# Patient Record
Sex: Female | Born: 1965 | Race: Black or African American | Hispanic: No | Marital: Married | Smoking: Never smoker
Health system: Southern US, Community
[De-identification: ages and names within clinical notes are randomized; demographics above are authoritative.]

## PROBLEM LIST (undated history)

## (undated) DIAGNOSIS — E669 Obesity, unspecified: Secondary | ICD-10-CM

## (undated) DIAGNOSIS — I10 Essential (primary) hypertension: Secondary | ICD-10-CM

## (undated) DIAGNOSIS — K635 Polyp of colon: Secondary | ICD-10-CM

## (undated) DIAGNOSIS — E785 Hyperlipidemia, unspecified: Secondary | ICD-10-CM

## (undated) DIAGNOSIS — K219 Gastro-esophageal reflux disease without esophagitis: Secondary | ICD-10-CM

## (undated) HISTORY — DX: Polyp of colon: K63.5

## (undated) HISTORY — DX: Obesity, unspecified: E66.9

## (undated) HISTORY — DX: Essential (primary) hypertension: I10

## (undated) HISTORY — PX: TUBAL LIGATION: SHX77

## (undated) HISTORY — DX: Hyperlipidemia, unspecified: E78.5

## (undated) HISTORY — DX: Gastro-esophageal reflux disease without esophagitis: K21.9

---

## 1998-08-07 ENCOUNTER — Encounter (INDEPENDENT_AMBULATORY_CARE_PROVIDER_SITE_OTHER): Payer: Self-pay | Admitting: *Deleted

## 1999-01-03 ENCOUNTER — Other Ambulatory Visit: Admission: RE | Admit: 1999-01-03 | Discharge: 1999-01-03 | Payer: Self-pay | Admitting: *Deleted

## 1999-07-25 ENCOUNTER — Other Ambulatory Visit: Admission: RE | Admit: 1999-07-25 | Discharge: 1999-07-25 | Payer: Self-pay | Admitting: Obstetrics and Gynecology

## 1999-08-31 ENCOUNTER — Ambulatory Visit (HOSPITAL_COMMUNITY): Admission: RE | Admit: 1999-08-31 | Discharge: 1999-08-31 | Payer: Self-pay | Admitting: Obstetrics and Gynecology

## 2000-08-05 ENCOUNTER — Other Ambulatory Visit: Admission: RE | Admit: 2000-08-05 | Discharge: 2000-08-05 | Payer: Self-pay | Admitting: Obstetrics and Gynecology

## 2003-11-01 ENCOUNTER — Other Ambulatory Visit: Admission: RE | Admit: 2003-11-01 | Discharge: 2003-11-01 | Payer: Self-pay | Admitting: Family Medicine

## 2004-10-31 ENCOUNTER — Other Ambulatory Visit: Admission: RE | Admit: 2004-10-31 | Discharge: 2004-10-31 | Payer: Self-pay | Admitting: Family Medicine

## 2004-11-14 ENCOUNTER — Ambulatory Visit (HOSPITAL_COMMUNITY): Admission: RE | Admit: 2004-11-14 | Discharge: 2004-11-14 | Payer: Self-pay | Admitting: Family Medicine

## 2005-11-19 ENCOUNTER — Ambulatory Visit (HOSPITAL_COMMUNITY): Admission: RE | Admit: 2005-11-19 | Discharge: 2005-11-19 | Payer: Self-pay | Admitting: Family Medicine

## 2005-12-04 ENCOUNTER — Other Ambulatory Visit: Admission: RE | Admit: 2005-12-04 | Discharge: 2005-12-04 | Payer: Self-pay | Admitting: Family Medicine

## 2006-11-27 ENCOUNTER — Ambulatory Visit (HOSPITAL_COMMUNITY): Admission: RE | Admit: 2006-11-27 | Discharge: 2006-11-27 | Payer: Self-pay | Admitting: Family Medicine

## 2006-12-19 ENCOUNTER — Other Ambulatory Visit: Admission: RE | Admit: 2006-12-19 | Discharge: 2006-12-19 | Payer: Self-pay | Admitting: Family Medicine

## 2007-12-03 ENCOUNTER — Ambulatory Visit (HOSPITAL_COMMUNITY): Admission: RE | Admit: 2007-12-03 | Discharge: 2007-12-03 | Payer: Self-pay | Admitting: Family Medicine

## 2008-12-05 ENCOUNTER — Ambulatory Visit (HOSPITAL_COMMUNITY): Admission: RE | Admit: 2008-12-05 | Discharge: 2008-12-05 | Payer: Self-pay | Admitting: Family Medicine

## 2009-07-05 ENCOUNTER — Other Ambulatory Visit: Admission: RE | Admit: 2009-07-05 | Discharge: 2009-07-05 | Payer: Self-pay | Admitting: Family Medicine

## 2009-12-18 ENCOUNTER — Ambulatory Visit (HOSPITAL_COMMUNITY): Admission: RE | Admit: 2009-12-18 | Discharge: 2009-12-18 | Payer: Self-pay | Admitting: Family Medicine

## 2010-05-18 ENCOUNTER — Encounter (INDEPENDENT_AMBULATORY_CARE_PROVIDER_SITE_OTHER): Payer: Self-pay | Admitting: *Deleted

## 2010-07-05 ENCOUNTER — Ambulatory Visit: Admit: 2010-07-05 | Payer: Self-pay | Admitting: Gastroenterology

## 2010-07-06 ENCOUNTER — Other Ambulatory Visit
Admission: RE | Admit: 2010-07-06 | Discharge: 2010-07-06 | Payer: Self-pay | Source: Home / Self Care | Admitting: Family Medicine

## 2010-08-02 NOTE — Letter (Signed)
Summary: New Patient letter  [facility]  [address] [place], [place] [address]   Phone: [phone]  Fax: [phone]       05/18/2010 MRN: [id number]  Carla Williams [address] [place], [place]  [address]  Dear Ms. Carla Williams,  Welcome to the Gastroenterology Division at [facility].    You are scheduled to see Dr. Jarold Motto on 07/05/2010 at 9:00AM on the 3rd floor at [facility], [address]  We ask that you try to arrive at our office 15 minutes prior to your appointment time to allow for check-in.  We would like you to complete the enclosed self-administered evaluation form prior to your visit and bring it with you on the day of your appointment.  We will review it with you.  Also, please bring a complete list of all your medications or, if you prefer, bring the medication bottles and we will list them.  Please bring your insurance card so that we may make a copy of it.  If your insurance requires a referral to see a specialist, please bring your referral form from your primary care physician.  Co-payments are due at the time of your visit and may be paid by cash, check or credit card.     Your office visit will consist of a consult with your physician (includes a physical exam), any laboratory testing he/she may order, scheduling of any necessary diagnostic testing (e.g. x-ray, ultrasound, CT-scan), and scheduling of a procedure (e.g. Endoscopy, Colonoscopy) if required.  Please allow enough time on your schedule to allow for any/all of these possibilities.    If you cannot keep your appointment, please call [phone] to cancel or reschedule prior to your appointment date.  This allows [place] the opportunity to schedule an appointment for another patient in need of care.  If you do not cancel or reschedule by 5 p.m. the business day prior to your appointment date, you will be charged a $50.00 late cancellation/no-show fee.    Thank you for choosing  [facility] for your medical needs.  We appreciate the opportunity to care for you.  Please visit [place] at our website  to learn more about our practice.                     Sincerely,                                                             The Gastroenterology Division

## 2010-08-10 ENCOUNTER — Ambulatory Visit: Payer: Self-pay | Admitting: Internal Medicine

## 2010-08-16 NOTE — Consult Note (Signed)
Summary: [facility]   Imported By: Sherian Rein 08/10/2010 11:46:23  _____________________________________________________________________  External Attachment:    Type:   Image     Comment:   External Document

## 2010-10-22 ENCOUNTER — Ambulatory Visit: Payer: Self-pay | Admitting: Internal Medicine

## 2010-11-16 NOTE — Op Note (Signed)
[facility]  Patient:    Carla Williams, Carla Williams                       MRN: [id number] Proc. Date: 08/31/99 Adm. Date:  [id number] Disc. Date: [id number] Attending:  Lenoard Aden CC:         [facility]                           Operative Report  PREOPERATIVE DIAGNOSIS:       Desire for elective sterilization.  POSTOPERATIVE DIAGNOSIS:      Desire for elective sterilization.  PROCEDURE:                    Laparoscopic tubal sterilization.  SURGEON:                      Lenoard Aden, M.D.  ANESTHESIA:                   General by Belva Agee, M.D.  ESTIMATED BLOOD LOSS:         Less than 50 cc.  COMPLICATIONS:                None.  DRAINS:                       None.  COUNTS:                       Correct.  DISPOSITION:                  Patient to recovery in good condition.  DESCRIPTION OF PROCEDURE:     After being apprised of the risks of anesthesia, infection, bleeding and injury to abdominal organs with need for repair, patient is brought to the operating room where she is administered a general anesthetic without complication.  Feet are placed in the [facility] stirrups.  She is then prepped and draped in the usual sterile fashion and catheterized until bladder is empty. Exam under anesthesia reveals an anteflexed uterus and no adnexal masses.  At this time, attention is turned to the abdominal portion of the procedure whereby an infraumbilical incision is made with a scalpel, Veress needle placed, opening pressure of 0 noted, 4 liters of CO2 insufflated without difficulty, trocar placed atraumatically, pictures taken; normal liver and gallbladder bed visualized, uterus with small calcified anterior fibroid, normal tubes and ovaries are noted. At this time, Kleppinger bipolar cautery entered into the abdominal cavity and he right tube is cauterized in three contiguous areas of ampullary-isthmic section of the tube, which is  followed out to the fimbriated end; the same procedure is done on the left fallopian tube, which is also traced out to the fimbriated end. Good hemostasis is achieved.  Hook scissors are used to divide both tubes in the cauterized area and tubal lumens are visualized.  Good hemostasis is achieved. CO2 is released.  Scope is removed under direct visualization and the incision is closed using a 0 Vicryl and skin glue is placed in the umbilical incision; good  hemostasis noted.  Instruments and Hulka tenaculum are removed from the vagina.  Patient tolerates procedure and is transferred to recovery in good condition.DD:  08/31/99 TD:  09/03/99 Job: [id number]

## 2010-11-23 ENCOUNTER — Other Ambulatory Visit (HOSPITAL_COMMUNITY): Payer: Self-pay | Admitting: Family Medicine

## 2010-11-23 DIAGNOSIS — Z1231 Encounter for screening mammogram for malignant neoplasm of breast: Secondary | ICD-10-CM

## 2010-12-20 ENCOUNTER — Ambulatory Visit (HOSPITAL_COMMUNITY)
Admission: RE | Admit: 2010-12-20 | Discharge: 2010-12-20 | Disposition: A | Discharge status: Home / Self Care | Payer: BC Managed Care – PPO | Source: Ambulatory Visit | Attending: Family Medicine | Admitting: Family Medicine

## 2010-12-20 DIAGNOSIS — Z1231 Encounter for screening mammogram for malignant neoplasm of breast: Secondary | ICD-10-CM | POA: Insufficient documentation

## 2011-01-14 ENCOUNTER — Encounter: Payer: Self-pay | Admitting: Internal Medicine

## 2011-01-14 ENCOUNTER — Ambulatory Visit (INDEPENDENT_AMBULATORY_CARE_PROVIDER_SITE_OTHER): Payer: BC Managed Care – PPO | Admitting: Internal Medicine

## 2011-01-14 VITALS — BP 132/80 | HR 74 | Ht 65.0 in | Wt 224.0 lb

## 2011-01-14 DIAGNOSIS — Z1211 Encounter for screening for malignant neoplasm of colon: Secondary | ICD-10-CM

## 2011-01-14 DIAGNOSIS — E119 Type 2 diabetes mellitus without complications: Secondary | ICD-10-CM

## 2011-01-14 DIAGNOSIS — K5900 Constipation, unspecified: Secondary | ICD-10-CM

## 2011-01-14 DIAGNOSIS — K219 Gastro-esophageal reflux disease without esophagitis: Secondary | ICD-10-CM

## 2011-01-14 MED ORDER — PEG-KCL-NACL-NASULF-NA ASC-C 100 G PO SOLR: 1.0000 | Freq: Once | ORAL | Status: DC

## 2011-01-14 NOTE — Patient Instructions (Signed)
Colon/Endo [facility] 03/07/11 9:00 am arrive at 8:00 am on 4th floor Moviprep sent to your pharmacy Colon/Endo brochure given to you for review Do not take your diabetic medications day of procedure.

## 2011-01-14 NOTE — Progress Notes (Signed)
HISTORY OF PRESENT ILLNESS:  Carla Williams is a 45 y.o. female hyperlipidemia, diabetes mellitus type 2, and obesity. She presents today regarding chronic problems with GERD. She describes a 3+ year history of problems with intermittent regurgitation and heartburn. Initially placed on Prevacid, subsequently Nexium. However, she takes the PPI therapy sporadically and with symptoms only. Averages several times per week. She is concerned about breakthrough symptoms. Some nocturnal symptoms. She reports 25 pound weight loss over the past 6 months. No dysphagia. Some gagging while brushing her teeth the morning. She also reports several months of constipation and no prior colon cancer screening. Works as a Child psychotherapist.  REVIEW OF SYSTEMS:  All non-GI ROS negative except for menstrual pain, night sweats, excessive thirst and excessive urination  Past Medical History  Diagnosis Date  . GERD (gastroesophageal reflux disease)   . Hyperlipemia   . Diabetes mellitus     Past Surgical History  Procedure Date  . Tubal ligation     Social History Carla Williams  reports that she has never smoked. She has never used smokeless tobacco. She reports that she does not drink alcohol or use illicit drugs.  family history includes Breast cancer in an unspecified family member; Diabetes in her father; and Heart disease in her father.  There is no history of Colon cancer.  No Known Allergies     PHYSICAL EXAMINATION: Vital signs: BP 132/80  Pulse 74  Ht 5\' 5"  (1.651 m)  Wt 224 lb (101.606 kg)  BMI 37.28 kg/m2  LMP 11/18/2010  Constitutional: generally well-appearing, no acute distress Psychiatric: alert and oriented x3, cooperative Eyes: extraocular movements intact, anicteric, conjunctiva pink Mouth: oral pharynx moist, no lesions Neck: supple no lymphadenopathy Cardiovascular: heart regular rate and rhythm, no murmur Lungs: clear to auscultation bilaterally Abdomen: soft, obese, nontender,  nondistended, no obvious ascites, no peritoneal signs, normal bowel sounds, no organomegaly Rectal: Deferred until colonoscopy Extremities: no lower extremity edema bilaterally Skin: no lesions on visible extremities Neuro: No focal deficits.   ASSESSMENT:  #1. Chronic GERD. #2. Colon cancer screening. Baseline risk at a 45 year old African American. #3. Constipation   PLAN:  #1. Advised to take Nexium 40 mg daily. To be taken 30 minutes before the first meal. #2. GERD literature provided for review #3. Reflux precautions #4. Diagnostic upper endoscopy.The nature of the procedure, as well as the risks, benefits, and alternatives were carefully and thoroughly reviewed with the patient. Ample time for discussion and questions allowed. The patient understood, was satisfied, and agreed to proceed.  #5. Screening colonoscopy.The nature of the procedure, as well as the risks, benefits, and alternatives were carefully and thoroughly reviewed with the patient. Ample time for discussion and questions allowed. The patient understood, was satisfied, and agreed to proceed. Movi prep prescribed. The patient instructed on its use. Hold diabetic agent as the day of the examination until resuming oral intake #6. Fiber and water for constipation

## 2011-03-05 ENCOUNTER — Telehealth: Payer: Self-pay | Admitting: Internal Medicine

## 2011-03-05 NOTE — Telephone Encounter (Signed)
No

## 2011-03-06 ENCOUNTER — Encounter: Payer: Self-pay | Admitting: *Deleted

## 2011-03-07 ENCOUNTER — Encounter: Payer: BC Managed Care – PPO | Admitting: Internal Medicine

## 2011-09-02 ENCOUNTER — Encounter (HOSPITAL_COMMUNITY): Payer: Self-pay

## 2011-09-02 ENCOUNTER — Emergency Department (HOSPITAL_COMMUNITY)
Admission: EM | Admit: 2011-09-02 | Discharge: 2011-09-02 | Disposition: A | Discharge status: Home / Self Care | Payer: BC Managed Care – PPO | Source: Home / Self Care

## 2011-09-02 DIAGNOSIS — J069 Acute upper respiratory infection, unspecified: Secondary | ICD-10-CM

## 2011-09-02 MED ORDER — GUAIFENESIN-CODEINE 100-10 MG/5ML PO SYRP: 5.0000 mL | ORAL_SOLUTION | Freq: Three times a day (TID) | ORAL | Status: AC | PRN

## 2011-09-02 NOTE — ED Provider Notes (Signed)
Carla Williams is a 46 y.o. female who presents to Urgent Care today for 3 days of cough headache bodyaches mild fever. She's used Tussend and ibuprofen which has helped some. She denies any chest pain or significant shortness of breath. She thinks her fever is improving. Her max temperature was 101. She works at a daycare.   PMH reviewed. Diabetes and hypertension ROS as above otherwise neg Medications reviewed. No current facility-administered medications for this encounter.   Current Outpatient Prescriptions  Medication Sig Dispense Refill  . atorvastatin (LIPITOR) 40 MG tablet One tablet by mouth once daily       . glimepiride (AMARYL) 4 MG tablet One tablet by mouth once a day       . guaiFENesin-codeine (ROBITUSSIN AC) 100-10 MG/5ML syrup Take 5 mLs by mouth 3 (three) times daily as needed for cough.  120 mL  0  . ibuprofen (ADVIL,MOTRIN) 200 MG tablet Take 200 mg by mouth. As needed       . metFORMIN (GLUCOPHAGE) 500 MG tablet Take 500 mg by mouth 2 (two) times daily with a meal.        . NEXIUM 40 MG capsule As needed       . peg 3350 powder (MOVIPREP) 100 G SOLR Take 1 kit (100 g total) by mouth once.  1 kit  0    Exam:  BP 147/87  Pulse 81  Temp(Src) 100 F (37.8 C) (Oral)  Resp 16  SpO2 100% Gen: Well NAD HEENT: EOMI,  MMM, normal tympanic membranes bilaterally Lungs: CTABL Nl WOB Heart: RRR no MRG Abd: NABS, NT, ND Exts: Non edematous BL  LE, warm and well perfused.   Assessment and Plan: 46 year old woman with viral URI with cough. Plan to treat symptoms with Tylenol and cough medicine consisting of guaifenesin and codeine.  Discussed warning flag signs or symptoms. Followup with PCP in one week if no improvement. Discussed blood pressure results today and encouraged followup.     Clementeen Graham, MD 09/02/11 [id number]

## 2011-09-02 NOTE — Discharge Instructions (Signed)
Thank you for coming in today. You should get better in a few days.  Go to the ER if you have crushing chest pain or trouble breathing.

## 2011-09-02 NOTE — ED Notes (Signed)
Pt c/o non productive cough onset Thursday. Pt states she has generalized body aches and fever.   Pt states she has been using Ibuprofen for fever.

## 2011-09-03 NOTE — ED Provider Notes (Signed)
Medical screening examination/treatment/procedure(s) were performed by resident physician or non-physician practitioner and as supervising physician I was immediately available for consultation/collaboration.   Barkley Bruns MD.    Barkley Bruns, MD 09/03/11 [phone]

## 2011-12-30 ENCOUNTER — Other Ambulatory Visit (HOSPITAL_COMMUNITY): Payer: Self-pay | Admitting: Family Medicine

## 2011-12-30 DIAGNOSIS — Z1231 Encounter for screening mammogram for malignant neoplasm of breast: Secondary | ICD-10-CM

## 2012-01-22 ENCOUNTER — Ambulatory Visit (HOSPITAL_COMMUNITY): Payer: BC Managed Care – PPO

## 2012-03-27 ENCOUNTER — Ambulatory Visit (HOSPITAL_COMMUNITY)
Admission: RE | Admit: 2012-03-27 | Discharge: 2012-03-27 | Disposition: A | Discharge status: Home / Self Care | Payer: BC Managed Care – PPO | Source: Ambulatory Visit | Attending: Family Medicine | Admitting: Family Medicine

## 2012-03-27 DIAGNOSIS — Z1231 Encounter for screening mammogram for malignant neoplasm of breast: Secondary | ICD-10-CM | POA: Insufficient documentation

## 2012-04-15 LAB — HM PAP SMEAR: HM Pap smear: NORMAL

## 2012-12-04 ENCOUNTER — Other Ambulatory Visit (HOSPITAL_COMMUNITY): Payer: Self-pay | Admitting: Family Medicine

## 2012-12-04 DIAGNOSIS — Z1231 Encounter for screening mammogram for malignant neoplasm of breast: Secondary | ICD-10-CM

## 2013-01-28 ENCOUNTER — Emergency Department (HOSPITAL_COMMUNITY)
Admission: EM | Admit: 2013-01-28 | Discharge: 2013-01-28 | Disposition: A | Discharge status: Home / Self Care | Payer: BC Managed Care – PPO | Source: Home / Self Care | Attending: Family Medicine | Admitting: Family Medicine

## 2013-01-28 ENCOUNTER — Ambulatory Visit: Payer: BC Managed Care – PPO | Admitting: Internal Medicine

## 2013-01-28 ENCOUNTER — Encounter (HOSPITAL_COMMUNITY): Payer: Self-pay | Admitting: Emergency Medicine

## 2013-01-28 DIAGNOSIS — IMO0001 Reserved for inherently not codable concepts without codable children: Secondary | ICD-10-CM

## 2013-01-28 DIAGNOSIS — L03019 Cellulitis of unspecified finger: Secondary | ICD-10-CM

## 2013-01-28 MED ORDER — IBUPROFEN 600 MG PO TABS: 600.0000 mg | ORAL_TABLET | Freq: Three times a day (TID) | ORAL | Status: DC | PRN

## 2013-01-28 MED ORDER — HYDROCODONE-ACETAMINOPHEN 5-325 MG PO TABS: 2.0000 | ORAL_TABLET | Freq: Three times a day (TID) | ORAL | Status: DC | PRN

## 2013-01-28 MED ORDER — DOXYCYCLINE HYCLATE 100 MG PO CAPS: 100.0000 mg | ORAL_CAPSULE | Freq: Two times a day (BID) | ORAL | Status: DC

## 2013-01-28 NOTE — ED Notes (Signed)
Pt c/o right middle finger pain onset Sunday... Denies inj/trauma... Recalls having a small paper cut and swelling at the side of the fingernail... Alert w/no signs of acute distress.

## 2013-01-28 NOTE — ED Notes (Signed)
Pt's hand soaking in warm tap water.

## 2013-01-29 NOTE — ED Provider Notes (Signed)
CSN: [id number]     Arrival date & time 01/28/13  1910 History     First MD Initiated Contact with Patient 01/28/13 1924     Chief Complaint  Patient presents with  . Hand Pain   (Consider location/radiation/quality/duration/timing/severity/associated sxs/prior Treatment) HPI Comments: 47 y/o female with h/o diabetes here c/o right middle finger throbbing pain around nail for 4 days. Denies known injury.    Past Medical History  Diagnosis Date  . GERD (gastroesophageal reflux disease)   . Hyperlipemia   . Diabetes mellitus    Past Surgical History  Procedure Laterality Date  . Tubal ligation     Family History  Problem Relation Age of Onset  . Colon cancer Neg Hx   . Breast cancer      Maternal Great Aunt   . Diabetes Father     and Mother  . Heart disease Father    History  Substance Use Topics  . Smoking status: Never Smoker   . Smokeless tobacco: Never Used  . Alcohol Use: No   OB History   Grav Para Term Preterm Abortions TAB SAB Ect Mult Living                 Review of Systems  Constitutional: Negative for fever and chills.  Musculoskeletal: Negative for joint swelling.  Skin:       Right middle finger swelling and pain at the tip as per HPI  All other systems reviewed and are negative.    Allergies  Review of patient's allergies indicates no known allergies.  Home Medications   Current Outpatient Rx  Name  Route  Sig  Dispense  Refill  . atorvastatin (LIPITOR) 40 MG tablet      One tablet by mouth once daily          . glimepiride (AMARYL) 4 MG tablet      One tablet by mouth once a day          . metFORMIN (GLUCOPHAGE) 500 MG tablet   Oral   Take 500 mg by mouth 2 (two) times daily with a meal.           . doxycycline (VIBRAMYCIN) 100 MG capsule   Oral   Take 1 capsule (100 mg total) by mouth 2 (two) times daily.   20 capsule   0   . HYDROcodone-acetaminophen (NORCO/VICODIN) 5-325 MG per tablet   Oral   Take 2 tablets by  mouth every 8 (eight) hours as needed for pain.   6 tablet   0   . ibuprofen (ADVIL,MOTRIN) 600 MG tablet   Oral   Take 1 tablet (600 mg total) by mouth every 8 (eight) hours as needed for pain.   15 tablet   0   . NEXIUM 40 MG capsule      As needed          . peg 3350 powder (MOVIPREP) 100 G SOLR   Oral   Take 1 kit (100 g total) by mouth once.   1 kit   0    BP 136/98  Pulse 84  Temp(Src) 98.2 F (36.8 C)  Resp 16  SpO2 100%  LMP 12/30/2012 Physical Exam  Nursing note and vitals reviewed. Constitutional: She is oriented to person, place, and time. She appears well-developed and well-nourished. No distress.  Cardiovascular: Normal heart sounds.   Pulmonary/Chest: Breath sounds normal.  Musculoskeletal:  Right middle finger: FROM at MPJ, PIPJ and DIPJ. No erythema or  tenderness over shaft of proximal or middle phalange dorsally or on palmar side.  Neurological: She is alert and oriented to person, place, and time.  Skin: She is not diaphoretic.  Right middle finger tip: Mild erythema swelling and tenderness to palpation at radial side of nail consistent with a paronychia. No focal pain at digital pad or other obvious signs suggestive of a felon.     ED Course   INCISION AND DRAINAGE Performed by: Sharin Grave Authorized by: Sharin Grave Consent: Verbal consent obtained. Risks and benefits: risks, benefits and alternatives were discussed Consent given by: patient Patient understanding: patient states understanding of the procedure being performed Patient consent: the patient's understanding of the procedure matches consent given Type: abscess Body area: upper extremity Location details: right long finger Anesthesia: local infiltration Local anesthetic: lidocaine 1% without epinephrine Anesthetic total: 1 ml Scalpel size: 11 Incision type: single straight Complexity: simple Drainage: purulent Drainage amount: scant Patient tolerance: Patient  tolerated the procedure well with no immediate complications. Comments: Antibiotic ointment and compressive dressing applied.   (including critical care time)  Labs Reviewed  CULTURE, ROUTINE-ABSCESS   No results found. 1. Paronychia of third finger, right     MDM  S/p I&D today. Prescribed doxycycline. Ibuprofen and tramadol. Supportive care and red flags that should prompt her return to medical attention discussed with patient and provided in writing.   Sharin Grave, MD 01/29/13 [phone]

## 2013-02-01 LAB — CULTURE, ROUTINE-ABSCESS

## 2013-02-15 ENCOUNTER — Encounter: Payer: Self-pay | Admitting: Internal Medicine

## 2013-02-15 ENCOUNTER — Ambulatory Visit (INDEPENDENT_AMBULATORY_CARE_PROVIDER_SITE_OTHER): Payer: BC Managed Care – PPO | Admitting: Internal Medicine

## 2013-02-15 VITALS — BP 120/80 | HR 85 | Temp 98.4°F | Resp 20 | Ht 65.0 in | Wt 225.0 lb

## 2013-02-15 DIAGNOSIS — I10 Essential (primary) hypertension: Secondary | ICD-10-CM

## 2013-02-15 DIAGNOSIS — E785 Hyperlipidemia, unspecified: Secondary | ICD-10-CM

## 2013-02-15 DIAGNOSIS — E669 Obesity, unspecified: Secondary | ICD-10-CM

## 2013-02-15 DIAGNOSIS — Z0000 Encounter for general adult medical examination without abnormal findings: Secondary | ICD-10-CM

## 2013-02-15 DIAGNOSIS — E119 Type 2 diabetes mellitus without complications: Secondary | ICD-10-CM | POA: Insufficient documentation

## 2013-02-15 DIAGNOSIS — K219 Gastro-esophageal reflux disease without esophagitis: Secondary | ICD-10-CM

## 2013-02-15 NOTE — Progress Notes (Signed)
Subjective:    Patient ID: Carla Williams, female    DOB: Nov 14, 1965, 47 y.o.   MRN: [id number]  HPI  47 year old patient who is seen today to establish with our practice. She has history of type 2 diabetes diagnosed in 2007. She is on her regimen of metformin glyburide and recently has resumed [place]. She has maintained the more recently reasonable glycemic control with fasting blood sugars occasionally less than 100. Her last hemoglobin A1c in June was reportedly greater than 12 however. She has a history of mild hypertension.  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 Status post CABG history of type 2 diabetes Mother age 78 with a history of a cerebral aneurysm also has type 2 diabetes and COPD One brother one sister in good health Brother with  impaired hearing  She complains of significant stress  Past Medical History  Diagnosis Date  . GERD (gastroesophageal reflux disease)   . Hyperlipemia   . Diabetes mellitus     History   Social History  . Marital Status: Married    Spouse Name: N/A    Number of Children: 2  . Years of Education: N/A   Occupational History  . Social Worker    Social History Main Topics  . Smoking status: Never Smoker   . Smokeless tobacco: Never Used  . Alcohol Use: No  . Drug Use: No  . Sexual Activity: Not on file   Other Topics Concern  . Not on file   Social History Narrative   One caffeine drink daily     Past Surgical History  Procedure Laterality Date  . Tubal ligation      Family History  Problem Relation Age of Onset  . Colon cancer Neg Hx   . Breast cancer      Maternal Great Aunt   . Diabetes Father     and Mother  . Heart disease Father     No Known Allergies  Current Outpatient Prescriptions on File Prior to Visit  Medication  Sig Dispense Refill  . atorvastatin (LIPITOR) 40 MG tablet One tablet by mouth once daily       . glimepiride (AMARYL) 4 MG tablet Take 4 mg by mouth 2 (two) times daily. One tablet by mouth once a day      . NEXIUM 40 MG capsule As needed       . peg 3350 powder (MOVIPREP) 100 G SOLR Take 1 kit (100 g total) by mouth once.  1 kit  0   No current facility-administered medications on file prior to visit.    BP 120/80  Pulse 85  Temp(Src) 98.4 F (36.9 C) (Oral)  Resp 20  Ht 5\' 5"  (1.651 m)  Wt 225 lb (102.059 kg)  BMI 37.44 kg/m2  SpO2 99%  LMP 01/20/2013     Review of Systems  Constitutional: Negative.   HENT: Negative for hearing loss, congestion, sore throat, rhinorrhea, dental problem, sinus pressure and tinnitus.   Eyes: Negative for pain, discharge and visual disturbance.  Respiratory: Negative for cough and shortness of breath.   Cardiovascular: Negative for chest pain, palpitations and leg swelling.  Gastrointestinal: Negative for nausea, vomiting, abdominal pain, diarrhea, constipation, blood in stool and abdominal distention.  Genitourinary: Negative for dysuria, urgency, frequency, hematuria, flank pain,  vaginal bleeding, vaginal discharge, difficulty urinating, vaginal pain and pelvic pain.  Musculoskeletal: Negative for joint swelling, arthralgias and gait problem.  Skin: Negative for rash.  Neurological: Negative for dizziness, syncope, speech difficulty, weakness, numbness and headaches.  Hematological: Negative for adenopathy.  Psychiatric/Behavioral: Negative for behavioral problems, dysphoric mood and agitation. The patient is nervous/anxious.        Objective:   Physical Exam  Constitutional: She is oriented to person, place, and time. She appears well-developed and well-nourished.  HENT:  Head: Normocephalic.  Right Ear: External ear normal.  Left Ear: External ear normal.  Mouth/Throat: Oropharynx is clear and moist.  Eyes: Conjunctivae and EOM are  normal. Pupils are equal, round, and reactive to light.  Neck: Normal range of motion. Neck supple. No thyromegaly present.  Cardiovascular: Normal rate, regular rhythm, normal heart sounds and intact distal pulses.   Pulmonary/Chest: Effort normal and breath sounds normal.  Abdominal: Soft. Bowel sounds are normal. She exhibits no mass. There is no tenderness.  Musculoskeletal: Normal range of motion.  Lymphadenopathy:    She has no cervical adenopathy.  Neurological: She is alert and oriented to person, place, and time.  Skin: Skin is warm and dry. No rash noted.  Psychiatric: She has a normal mood and affect. Her behavior is normal.          Assessment & Plan:   Preventive health exam Diabetes mellitus. Will continue present regimen check hemoglobin A1c in 2 months. We'll consider alternate therapy at that time if hemoglobin A1c is not at goal dyslipidemia Obesity

## 2013-02-15 NOTE — Patient Instructions (Addendum)
Limit your sodium (Salt) intake    It is important that you exercise regularly, at least 20 minutes 3 to 4 times per week.  If you develop chest pain or shortness of breath seek  medical attention.  You need to lose weight.  Consider a lower calorie diet and regular exercise.   Please check your hemoglobin A1c every 3 months  Please check your blood pressure on a regular basis.  If it is consistently greater than 150/90, please make an office appointment.   

## 2013-03-10 ENCOUNTER — Telehealth: Payer: Self-pay | Admitting: Internal Medicine

## 2013-03-10 NOTE — Telephone Encounter (Signed)
Pt needs refills on ibuprofen 800 mg,nexium 40 mg #90 atorvastatin 40 mg #90,benezepril-hctz 10-12.5 mg #90 ,metformin 1000mg  twice a day #180 and januvia 100 mg #90 and glimepiride 4 mg twice a day #180 with 3 refills on each meds. [facility] [address]

## 2013-03-11 MED ORDER — IBUPROFEN 800 MG PO TABS: 800.0000 mg | ORAL_TABLET | ORAL | Status: DC | PRN

## 2013-03-11 MED ORDER — GLIMEPIRIDE 4 MG PO TABS: 4.0000 mg | ORAL_TABLET | Freq: Two times a day (BID) | ORAL | Status: DC

## 2013-03-11 MED ORDER — ESOMEPRAZOLE MAGNESIUM 40 MG PO CPDR: 40.0000 mg | DELAYED_RELEASE_CAPSULE | Freq: Every day | ORAL | Status: DC

## 2013-03-11 MED ORDER — BENAZEPRIL-HYDROCHLOROTHIAZIDE 10-12.5 MG PO TABS: 1.0000 | ORAL_TABLET | Freq: Every day | ORAL | Status: DC

## 2013-03-11 MED ORDER — SITAGLIPTIN PHOSPHATE 100 MG PO TABS: 100.0000 mg | ORAL_TABLET | Freq: Every day | ORAL | Status: DC

## 2013-03-11 MED ORDER — ATORVASTATIN CALCIUM 40 MG PO TABS: 40.0000 mg | ORAL_TABLET | Freq: Every day | ORAL | Status: DC

## 2013-03-11 MED ORDER — METFORMIN HCL 1000 MG PO TABS: 1000.0000 mg | ORAL_TABLET | Freq: Two times a day (BID) | ORAL | Status: DC

## 2013-03-11 NOTE — Telephone Encounter (Signed)
Pt notified Rx's were sent to pharmacy as requested. 

## 2013-03-29 ENCOUNTER — Ambulatory Visit (HOSPITAL_COMMUNITY)
Admission: RE | Admit: 2013-03-29 | Discharge: 2013-03-29 | Disposition: A | Discharge status: Home / Self Care | Payer: BC Managed Care – PPO | Source: Ambulatory Visit | Attending: Family Medicine | Admitting: Family Medicine

## 2013-03-29 DIAGNOSIS — Z1231 Encounter for screening mammogram for malignant neoplasm of breast: Secondary | ICD-10-CM | POA: Insufficient documentation

## 2013-05-18 ENCOUNTER — Encounter: Payer: Self-pay | Admitting: Internal Medicine

## 2013-05-18 ENCOUNTER — Ambulatory Visit (INDEPENDENT_AMBULATORY_CARE_PROVIDER_SITE_OTHER): Payer: BC Managed Care – PPO | Admitting: Internal Medicine

## 2013-05-18 VITALS — BP 130/80 | HR 84 | Temp 97.9°F | Resp 20 | Wt 235.0 lb

## 2013-05-18 DIAGNOSIS — I10 Essential (primary) hypertension: Secondary | ICD-10-CM

## 2013-05-18 DIAGNOSIS — Z23 Encounter for immunization: Secondary | ICD-10-CM

## 2013-05-18 DIAGNOSIS — E785 Hyperlipidemia, unspecified: Secondary | ICD-10-CM

## 2013-05-18 DIAGNOSIS — E119 Type 2 diabetes mellitus without complications: Secondary | ICD-10-CM

## 2013-05-18 DIAGNOSIS — E669 Obesity, unspecified: Secondary | ICD-10-CM

## 2013-05-18 LAB — MICROALBUMIN / CREATININE URINE RATIO
Creatinine,U: 274 mg/dL
Microalb Creat Ratio: 0.8 mg/g (ref 0.0–30.0)

## 2013-05-18 MED ORDER — CANAGLIFLOZIN 300 MG PO TABS
1.0000 | ORAL_TABLET | Freq: Every day | ORAL | Status: DC
Start: 2013-05-18 — End: 2014-06-06

## 2013-05-18 NOTE — Progress Notes (Signed)
Pre-visit discussion using our clinic review tool. No additional management support is needed unless otherwise documented below in the visit note.  

## 2013-05-18 NOTE — Progress Notes (Signed)
Subjective:    Patient ID: Carla Williams, female    DOB: 05-11-1966, 47 y.o.   MRN: [id number]  HPI Pre-visit discussion using our clinic review tool. No additional management support is needed unless otherwise documented below in the visit note.  47 year old patient who is seen today for followup of 2 diabetes hypertension and dyslipidemia. There is a history of exogenous obesity and a weight gain of 10 pounds since her last visit. She currently feels well. Since her last visit she has had a mammogram in gynecology evaluation. She is scheduled for her annual eye examination in January No new concerns or complaints. She states blood sugars are generally fairly well controlled morning blood approach 200 later in the day. She is on triple oral therapy.  Past Medical History  Diagnosis Date  . GERD (gastroesophageal reflux disease)   . Hyperlipemia   . Diabetes mellitus     History   Social History  . Marital Status: Married    Spouse Name: N/A    Number of Children: 2  . Years of Education: N/A   Occupational History  . Social Worker    Social History Main Topics  . Smoking status: Never Smoker   . Smokeless tobacco: Never Used  . Alcohol Use: No  . Drug Use: No  . Sexual Activity: Not on file   Other Topics Concern  . Not on file   Social History Narrative   One caffeine drink daily     Past Surgical History  Procedure Laterality Date  . Tubal ligation      Family History  Problem Relation Age of Onset  . Colon cancer Neg Hx   . Breast cancer      Maternal Great Aunt   . Diabetes Father     and Mother  . Heart disease Father     No Known Allergies  Current Outpatient Prescriptions on File Prior to Visit  Medication Sig Dispense Refill  . atorvastatin (LIPITOR) 40 MG tablet Take 1 tablet (40 mg total) by mouth daily. One tablet by mouth once daily  90 tablet  3  . benazepril-hydrochlorthiazide (LOTENSIN HCT) 10-12.5 MG per tablet Take 1 tablet by mouth  daily.  90 tablet  3  . esomeprazole (NEXIUM) 40 MG capsule Take 1 capsule (40 mg total) by mouth daily before breakfast. As needed  90 capsule  3  . glimepiride (AMARYL) 4 MG tablet Take 1 tablet (4 mg total) by mouth 2 (two) times daily. One tablet by mouth once a day  180 tablet  3  . ibuprofen (ADVIL,MOTRIN) 800 MG tablet Take 1 tablet (800 mg total) by mouth as needed for pain.  90 tablet  1  . metFORMIN (GLUCOPHAGE) 1000 MG tablet Take 1 tablet (1,000 mg total) by mouth 2 (two) times daily with a meal.  180 tablet  3  . peg 3350 powder (MOVIPREP) 100 G SOLR Take 1 kit (100 g total) by mouth once.  1 kit  0  . sitaGLIPtin (JANUVIA) 100 MG tablet Take 1 tablet (100 mg total) by mouth daily.  90 tablet  3   No current facility-administered medications on file prior to visit.    BP 130/80  Pulse 84  Temp(Src) 97.9 F (36.6 C) (Oral)  Resp 20  Wt 235 lb (106.595 kg)  SpO2 97%       Review of Systems  Constitutional: Negative.   HENT: Negative for congestion, dental problem, hearing loss, rhinorrhea, sinus pressure, sore throat  and tinnitus.   Eyes: Negative for pain, discharge and visual disturbance.  Respiratory: Negative for cough and shortness of breath.   Cardiovascular: Negative for chest pain, palpitations and leg swelling.  Gastrointestinal: Negative for nausea, vomiting, abdominal pain, diarrhea, constipation, blood in stool and abdominal distention.  Genitourinary: Negative for dysuria, urgency, frequency, hematuria, flank pain, vaginal bleeding, vaginal discharge, difficulty urinating, vaginal pain and pelvic pain.  Musculoskeletal: Negative for arthralgias, gait problem and joint swelling.  Skin: Negative for rash.  Neurological: Negative for dizziness, syncope, speech difficulty, weakness, numbness and headaches.  Hematological: Negative for adenopathy.  Psychiatric/Behavioral: Negative for behavioral problems, dysphoric mood and agitation. The patient is not  nervous/anxious.        Objective:   Physical Exam  Constitutional: She is oriented to person, place, and time. She appears well-developed and well-nourished.  HENT:  Head: Normocephalic.  Right Ear: External ear normal.  Left Ear: External ear normal.  Mouth/Throat: Oropharynx is clear and moist.  Eyes: Conjunctivae and EOM are normal. Pupils are equal, round, and reactive to light.  Neck: Normal range of motion. Neck supple. No thyromegaly present.  Cardiovascular: Normal rate, regular rhythm, normal heart sounds and intact distal pulses.   Pulmonary/Chest: Effort normal and breath sounds normal.  Abdominal: Soft. Bowel sounds are normal. She exhibits no mass. There is no tenderness.  Musculoskeletal: Normal range of motion.  Lymphadenopathy:    She has no cervical adenopathy.  Neurological: She is alert and oriented to person, place, and time.  Skin: Skin is warm and dry. No rash noted.  Psychiatric: She has a normal mood and affect. Her behavior is normal.          Assessment & Plan:   Diabetes. We'll check a hemoglobin A1c. Lifestyle issues discussed. We'll substitute Invokanna for Januvia. Weight loss encouraged Hypertension controlled Obesity dyslipidemia

## 2013-05-18 NOTE — Progress Notes (Signed)
  Subjective:    Patient ID: Carla Williams, female    DOB: 15-Feb-1966, 47 y.o.   MRN: [id number]  HPI  Wt Readings from Last 3 Encounters:  05/18/13 235 lb (106.595 kg)  02/15/13 225 lb (102.059 kg)  01/14/11 224 lb (101.606 kg)     Review of Systems     Objective:   Physical Exam        Assessment & Plan:

## 2013-05-18 NOTE — Patient Instructions (Signed)
Limit your sodium (Salt) intake   Please check your hemoglobin A1c every 3 months  You need to lose weight.  Consider a lower calorie diet and regular exercise.    It is important that you exercise regularly, at least 20 minutes 3 to 4 times per week.  If you develop chest pain or shortness of breath seek  medical attention. 

## 2013-09-17 ENCOUNTER — Ambulatory Visit: Payer: BC Managed Care – PPO | Admitting: Endocrinology

## 2013-09-21 ENCOUNTER — Ambulatory Visit (INDEPENDENT_AMBULATORY_CARE_PROVIDER_SITE_OTHER): Payer: BC Managed Care – PPO | Admitting: Endocrinology

## 2013-09-21 VITALS — BP 118/82 | HR 75 | Temp 98.3°F | Resp 16 | Ht 65.0 in | Wt 229.8 lb

## 2013-09-21 DIAGNOSIS — E785 Hyperlipidemia, unspecified: Secondary | ICD-10-CM

## 2013-09-21 DIAGNOSIS — E669 Obesity, unspecified: Secondary | ICD-10-CM

## 2013-09-21 DIAGNOSIS — I10 Essential (primary) hypertension: Secondary | ICD-10-CM

## 2013-09-21 DIAGNOSIS — E119 Type 2 diabetes mellitus without complications: Secondary | ICD-10-CM

## 2013-09-21 LAB — LIPID PANEL
CHOL/HDL RATIO: 3
Cholesterol: 143 mg/dL (ref 0–200)
HDL: 55.7 mg/dL (ref >39.00)
LDL CALC: 72 mg/dL (ref 0–99)
Triglycerides: 75 mg/dL (ref 0.0–149.0)
VLDL: 15 mg/dL (ref 0.0–40.0)

## 2013-09-21 LAB — COMPREHENSIVE METABOLIC PANEL
ALK PHOS: 47 U/L (ref 39–117)
ALT: 39 U/L — AB (ref 0–35)
AST: 32 U/L (ref 0–37)
Albumin: 4.2 g/dL (ref 3.5–5.2)
BILIRUBIN TOTAL: 1 mg/dL (ref 0.3–1.2)
BUN: 10 mg/dL (ref 6–23)
CO2: 27 mEq/L (ref 19–32)
Calcium: 9.3 mg/dL (ref 8.4–10.5)
Chloride: 100 mEq/L (ref 96–112)
Creatinine, Ser: 0.8 mg/dL (ref 0.4–1.2)
GFR: 101.22 mL/min (ref >60.00)
Glucose, Bld: 96 mg/dL (ref 70–99)
Potassium: 4 mEq/L (ref 3.5–5.1)
SODIUM: 135 meq/L (ref 135–145)
TOTAL PROTEIN: 6.9 g/dL (ref 6.0–8.3)

## 2013-09-21 LAB — HEMOGLOBIN A1C: Hgb A1c MFr Bld: 7.9 % — ABNORMAL HIGH (ref 4.6–6.5)

## 2013-09-21 LAB — GLUCOSE, POCT (MANUAL RESULT ENTRY): POC GLUCOSE: 118 mg/dL — AB (ref 70–99)

## 2013-09-21 MED ORDER — DULAGLUTIDE 0.75 MG/0.5ML ~~LOC~~ SOAJ: 0.7500 mg | SUBCUTANEOUS | Status: DC

## 2013-09-21 NOTE — Progress Notes (Signed)
Patient ID: Carla Williams, female   DOB: 1965-07-18, 48 y.o.   MRN: [id number]    Reason for Appointment : Consultation for Type 2 Diabetes  History of Present Illness          Diagnosis: Type 2 diabetes mellitus, date of diagnosis: 2007      Past history: She was initially told to have mild diabetes and advised to lose weight with diet and exercise. About a year later because of higher blood sugars she was started on metformin low dose and this was increased. However because of lack of adequate control she was given Amaryl in addition soon after She thinks her blood sugars and overall were subsequently controlled with A1c as low as around 6% but records are not available In 2014 blood sugars apparently started increasing significantly. She was initially given Januvia but subsequently in 11/14 she still had a high A1c Previously she thinks she had been somewhat irregular with her medication compliance  Recent history:  She has taken [place] since 11/14 which was started because of a high A1c of 9.1% She is subsequently also has been trying to be more compliant with all her medications Blood sugars may be somewhat better but she has not lost much weight Only recently had mild yeast infection which is improved now However has been checking her blood sugar somewhat irregularly and did not bring any records for review. Having some difficulty using her new glucose monitor also She thinks her blood sugars are mostly high after meals       Oral hypoglycemic drugs the patient is taking are: Invokana, Amaryl, metformin      Side effects from medications have been: No significant side effects  Glucose monitoring:  done < one time a day         Glucometer: One Touch .      Blood Glucose readings in the morning: 127, 136. After meals: 213, 327  Hypoglycemia: None      Glycemic control:   Lab Results  Component Value Date   HGBA1C 9.1* 05/18/2013   Lab Results  Component Value Date   MICROALBUR 2.3* 05/18/2013    Self-care: The diet that the patient has been following is: tries to limit carbohydrates.      Meals: 2-3 meals per day. Lunch may be larger           Exercise: none          Dietician visit: never             Compliance with the medical regimen:  Retinal exam: Most recent:    Weight history:  Wt Readings from Last 3 Encounters:  09/21/13 229 lb 12.8 oz (104.237 kg)  05/18/13 235 lb (106.595 kg)  02/15/13 225 lb (102.059 kg)      Medication List       This list is accurate as of: 09/21/13  9:09 AM.  Always use your most recent med list.               atorvastatin 40 MG tablet  Commonly known as:  LIPITOR  Take 1 tablet (40 mg total) by mouth daily. One tablet by mouth once daily     benazepril-hydrochlorthiazide 10-12.5 MG per tablet  Commonly known as:  LOTENSIN HCT  Take 1 tablet by mouth daily.     Canagliflozin 300 MG Tabs  Commonly known as:  INVOKANA  Take 1 tablet (300 mg total) by mouth daily.     esomeprazole  40 MG capsule  Commonly known as:  NEXIUM  Take 1 capsule (40 mg total) by mouth daily before breakfast. As needed     glimepiride 4 MG tablet  Commonly known as:  AMARYL  Take 1 tablet (4 mg total) by mouth 2 (two) times daily. One tablet by mouth once a day     ibuprofen 800 MG tablet  Commonly known as:  ADVIL,MOTRIN  Take 1 tablet (800 mg total) by mouth as needed for pain.     metFORMIN 1000 MG tablet  Commonly known as:  GLUCOPHAGE  Take 1 tablet (1,000 mg total) by mouth 2 (two) times daily with a meal.     peg 3350 powder 100 G Solr  Commonly known as:  MOVIPREP  Take 1 kit (100 g total) by mouth once.        Allergies: No Known Allergies  Past Medical History  Diagnosis Date  . GERD (gastroesophageal reflux disease)   . Hyperlipemia   . Diabetes mellitus     Past Surgical History  Procedure Laterality Date  . Tubal ligation      Family History  Problem Relation Age of Onset  . Colon  cancer Neg Hx   . Breast cancer      Maternal Great Aunt   . Diabetes Father     and Mother  . Heart disease Father     Social History:  reports that she has never smoked. She has never used smokeless tobacco. She reports that she does not drink alcohol or use illicit drugs.    Review of Systems       Lipids: She has been on Lipitor from her previous PCP but not clear of her level of control  No results found for this basename: CHOL, HDL, LDLCALC, LDLDIRECT, TRIG, CHOLHDL                    Skin: No rash or any infections     Thyroid:  No  unusual fatigue.     The blood pressure has been controlled with Lotensin HCT over the last couple of years      No swelling of feet.     No shortness of breath on exertion.     Bowel habits: Normal.       No frequency of urination or nocturia       No joint  Pains. She is having pain in her left hand for the last 2-3 months. This is intermittent and sometimes sharp. Not associated with numbness, tingling or pins and needles. May be better with diving, typing and sometimes at night       No  depression          No history of Numbness, tingling or burning in  feet          No periods since 9/14, hot flashes gone now. Not on HRT; has been followed by gynecologist   LABS:  No visits with results within 1 Week(s) from this visit. Latest known visit with results is:  Office Visit on 05/18/2013  Component Date Value Ref Range Status  . Microalb, Ur 05/18/2013 2.3* 0.0 - 1.9 mg/dL Final  . Creatinine,U 05/18/2013 274.0   Final  . Microalb Creat Ratio 05/18/2013 0.8  0.0 - 30.0 mg/g Final  . Hemoglobin A1C 05/18/2013 9.1* 4.6 - 6.5 % Final   Glycemic Control Guidelines for People with Diabetes:Non Diabetic:  <6%Goal of Therapy: <7%Additional Action Suggested:  >8%  Physical Examination:  BP 118/82  Pulse 75  Temp(Src) 98.3 F (36.8 C)  Resp 16  Ht 5' 5"  (1.651 m)  Wt 229 lb 12.8 oz (104.237 kg)  BMI 38.24 kg/m2  SpO2 97%   LMP 03/15/2013  GENERAL:         Patient has generalized obesity.   HEENT:         Eye exam shows normal external appearance. Fundus exam shows no retinopathy. Oral exam shows normal mucosa .  NECK:         General:  Neck exam shows no lymphadenopathy. Carotids are normal to palpation and no bruit heard.  Thyroid is not enlarged and no nodules felt.   LUNGS:         Chest is symmetrical. Lungs are clear to auscultation.Marland Kitchen   HEART:         Heart sounds:  S1 and S2 are normal. No murmurs or clicks heard., no S3 or S4.   ABDOMEN:         General:  There is no distention present. Liver and spleen are not palpable. No other mass or tenderness present.  EXTREMITIES:     There is no edema. No skin lesions present.Marland Kitchen  NEUROLOGICAL:        Tinel sign is negative on the left Vibration sense is minimally reduced in toes. Ankle jerks are 2+ bilaterally.          Diabetic foot exam:  as in the foot exam section MUSCULOSKELETAL:       There is no enlargement or deformity of the joints. Spine is normal to inspection.Marland Kitchen   PEDAL pulses: Normal SKIN:       No rash. She does have  Mild to moderate acanthosis with pigmentation of her neck     ASSESSMENT:  Diabetes type 2, uncontrolled    She appears to have had progression of her diabetes over the last 7 years and appears to still have high readings despite 3 drug regimen of maximum doses Invokana, metformin and Amaryl She also has difficulty losing weight and has a BMI of almost 40 Although she has some room for improvement of her diet and exercise regimen she needs more counseling for meal planning and day-to-day care  Complications: None, needs regular eye exams. There is no evidence of neuropathy or nephropathy  HYPERTENSION: Blood pressure well controlled  Left arm pain. May have carpal tunnel syndrome and she will discuss with PCP  PLAN:  She is a good candidate for a GLP drug for improved diabetes control and weight loss. Discussed with the patient  the nature of GLP-1 drugs, the action on various organ systems, how they benefit blood glucose control, as well as the benefit of weight loss and  increase satiety . Explained possible side effects especially nausea and vomiting; discussed safety information in package insert. Specifically we discussed Trulicity and Victoza.  Demonstrated the medication injection device for Trulicity and injection technique to the patient. Discussed injection sites and titration of Trulicity starting with 0.75 mg once a week for 2 weeks and then increasing to 1.5 mg if no symptoms of nausea.  Patient brochure on Trulicity and co-pay card given She will call if she has any difficulties with the medication or insurance coverage especially since Victoza is preferred on her formulary  Also she will need further diabetes education and will refer her for nutritional counseling For now will reduce her Amaryl to 2 mg in the evening and consider stopping the  evening dose To continue metformin and Invokana Regular exercise  HYPERLIPIDEMIA: Will have her labs checked as no recent levels available   Carla Williams 09/21/2013, 9:09 AM

## 2013-09-21 NOTE — Patient Instructions (Signed)
Reduce pm Glimeperide to 1/2 and if sugar in am get <100

## 2013-10-07 ENCOUNTER — Encounter: Payer: Self-pay | Admitting: Family Medicine

## 2013-10-07 ENCOUNTER — Ambulatory Visit (INDEPENDENT_AMBULATORY_CARE_PROVIDER_SITE_OTHER): Payer: BC Managed Care – PPO | Admitting: Family Medicine

## 2013-10-07 VITALS — BP 106/76 | HR 84 | Temp 98.6°F | Wt 230.2 lb

## 2013-10-07 DIAGNOSIS — R05 Cough: Secondary | ICD-10-CM

## 2013-10-07 DIAGNOSIS — J329 Chronic sinusitis, unspecified: Secondary | ICD-10-CM

## 2013-10-07 DIAGNOSIS — R059 Cough, unspecified: Secondary | ICD-10-CM

## 2013-10-07 MED ORDER — AMOXICILLIN-POT CLAVULANATE 875-125 MG PO TABS: 1.0000 | ORAL_TABLET | Freq: Two times a day (BID) | ORAL | Status: DC

## 2013-10-07 MED ORDER — HYDROCODONE-HOMATROPINE 5-1.5 MG/5ML PO SYRP: ORAL_SOLUTION | ORAL | Status: DC

## 2013-10-07 NOTE — Patient Instructions (Addendum)
Get otc antihistamine and flonase or nasacort    Sinusitis Sinusitis is redness, soreness, and swelling (inflammation) of the paranasal sinuses. Paranasal sinuses are air pockets within the bones of your face (beneath the eyes, the middle of the forehead, or above the eyes). In healthy paranasal sinuses, mucus is able to drain out, and air is able to circulate through them by way of your nose. However, when your paranasal sinuses are inflamed, mucus and air can become trapped. This can allow bacteria and other germs to grow and cause infection. Sinusitis can develop quickly and last only a short time (acute) or continue over a long period (chronic). Sinusitis that lasts for more than 12 weeks is considered chronic.  CAUSES  Causes of sinusitis include:  Allergies.  Structural abnormalities, such as displacement of the cartilage that separates your nostrils (deviated septum), which can decrease the air flow through your nose and sinuses and affect sinus drainage.  Functional abnormalities, such as when the small hairs (cilia) that line your sinuses and help remove mucus do not work properly or are not present. SYMPTOMS  Symptoms of acute and chronic sinusitis are the same. The primary symptoms are pain and pressure around the affected sinuses. Other symptoms include:  Upper toothache.  Earache.  Headache.  Bad breath.  Decreased sense of smell and taste.  A cough, which worsens when you are lying flat.  Fatigue.  Fever.  Thick drainage from your nose, which often is green and may contain pus (purulent).  Swelling and warmth over the affected sinuses. DIAGNOSIS  Your caregiver will perform a physical exam. During the exam, your caregiver may:  Look in your nose for signs of abnormal growths in your nostrils (nasal polyps).  Tap over the affected sinus to check for signs of infection.  View the inside of your sinuses (endoscopy) with a special imaging device with a light  attached (endoscope), which is inserted into your sinuses. If your caregiver suspects that you have chronic sinusitis, one or more of the following tests may be recommended:  Allergy tests.  Nasal culture A sample of mucus is taken from your nose and sent to a lab and screened for bacteria.  Nasal cytology A sample of mucus is taken from your nose and examined by your caregiver to determine if your sinusitis is related to an allergy. TREATMENT  Most cases of acute sinusitis are related to a viral infection and will resolve on their own within 10 days. Sometimes medicines are prescribed to help relieve symptoms (pain medicine, decongestants, nasal steroid sprays, or saline sprays).  However, for sinusitis related to a bacterial infection, your caregiver will prescribe antibiotic medicines. These are medicines that will help kill the bacteria causing the infection.  Rarely, sinusitis is caused by a fungal infection. In theses cases, your caregiver will prescribe antifungal medicine. For some cases of chronic sinusitis, surgery is needed. Generally, these are cases in which sinusitis recurs more than 3 times per year, despite other treatments. HOME CARE INSTRUCTIONS   Drink plenty of water. Water helps thin the mucus so your sinuses can drain more easily.  Use a humidifier.  Inhale steam 3 to 4 times a day (for example, sit in the bathroom with the shower running).  Apply a warm, moist washcloth to your face 3 to 4 times a day, or as directed by your caregiver.  Use saline nasal sprays to help moisten and clean your sinuses.  Take over-the-counter or prescription medicines for pain, discomfort, or  fever only as directed by your caregiver. SEEK IMMEDIATE MEDICAL CARE IF:  You have increasing pain or severe headaches.  You have nausea, vomiting, or drowsiness.  You have swelling around your face.  You have vision problems.  You have a stiff neck.  You have difficulty breathing. MAKE  SURE YOU:   Understand these instructions.  Will watch your condition.  Will get help right away if you are not doing well or get worse. Document Released: 06/17/2005 Document Revised: 09/09/2011 Document Reviewed: 07/02/2011 [facility] Patient Information 2014 [place], [place].

## 2013-10-07 NOTE — Progress Notes (Signed)
Pre visit review using our clinic review tool, if applicable. No additional management support is needed unless otherwise documented below in the visit note. 

## 2013-10-07 NOTE — Progress Notes (Signed)
  Subjective:     Carla Williams is a 48 y.o. female who presents for evaluation of symptoms of a URI. Symptoms include right ear pressure/pain, congestion, cough described as productive in am, low grade fever, post nasal drip, sinus pressure and sore throat. Onset of symptoms was 4 days ago, and has been gradually worsening since that time. Treatment to date: none.  The following portions of the patient's history were reviewed and updated as appropriate: allergies, current medications, past family history, past medical history, past social history, past surgical history and problem list.  Review of Systems Pertinent items are noted in HPI.   Objective:    BP 106/76  Pulse 84  Temp(Src) 98.6 F (37 C) (Oral)  Wt 230 lb 3.2 oz (104.418 kg)  SpO2 98%  LMP 03/15/2013 General appearance: alert, cooperative, appears stated age and no distress Head: Normocephalic, without obvious abnormality, atraumatic Ears: normal TM's and external ear canals both ears Nose: green discharge, moderate congestion, turbinates red, swollen, sinus tenderness bilateral Throat: lips, mucosa, and tongue normal; teeth and gums normal Neck: moderate anterior cervical adenopathy, supple, symmetrical, trachea midline and thyroid not enlarged, symmetric, no tenderness/mass/nodules Lungs: clear to auscultation bilaterally Heart: S1, S2 normal Extremities: extremities normal, atraumatic, no cyanosis or edema   Assessment:    sinusitis   Plan:    Discussed the diagnosis and treatment of sinusitis. Suggested symptomatic OTC remedies. Augmentin per orders. Nasal steroids per orders. Follow up as needed.

## 2013-10-15 ENCOUNTER — Other Ambulatory Visit: Payer: BC Managed Care – PPO

## 2013-10-19 ENCOUNTER — Ambulatory Visit: Payer: BC Managed Care – PPO | Admitting: Endocrinology

## 2014-02-14 ENCOUNTER — Ambulatory Visit (INDEPENDENT_AMBULATORY_CARE_PROVIDER_SITE_OTHER): Payer: BC Managed Care – PPO | Admitting: Internal Medicine

## 2014-02-14 ENCOUNTER — Encounter: Payer: Self-pay | Admitting: Internal Medicine

## 2014-02-14 VITALS — BP 136/90 | HR 74 | Temp 98.1°F | Resp 20 | Ht 65.0 in | Wt 226.0 lb

## 2014-02-14 DIAGNOSIS — I10 Essential (primary) hypertension: Secondary | ICD-10-CM

## 2014-02-14 DIAGNOSIS — F411 Generalized anxiety disorder: Secondary | ICD-10-CM

## 2014-02-14 DIAGNOSIS — E119 Type 2 diabetes mellitus without complications: Secondary | ICD-10-CM

## 2014-02-14 MED ORDER — LORAZEPAM 0.5 MG PO TABS: 0.5000 mg | ORAL_TABLET | Freq: Two times a day (BID) | ORAL | Status: DC | PRN

## 2014-02-14 NOTE — Progress Notes (Signed)
Pre visit review using our clinic review tool, if applicable. No additional management support is needed unless otherwise documented below in the visit note. 

## 2014-02-14 NOTE — Patient Instructions (Signed)
Call or return to clinic prn if these symptoms worsen or fail to improve as anticipated.  Stress and Stress Management Stress is a normal reaction to life events. It is what you feel when life demands more than you are used to or more than you can handle. Some stress can be useful. For example, the stress reaction can help you catch the last bus of the day, study for a test, or meet a deadline at work. But stress that occurs too often or for too long can cause problems. It can affect your emotional health and interfere with relationships and normal daily activities. Too much stress can weaken your immune system and increase your risk for physical illness. If you already have a medical problem, stress can make it worse. CAUSES  All sorts of life events may cause stress. An event that causes stress for one person may not be stressful for another person. Major life events commonly cause stress. These may be positive or negative. Examples include losing your job, moving into a new home, getting married, having a baby, or losing a loved one. Less obvious life events may also cause stress, especially if they occur day after day or in combination. Examples include working long hours, driving in traffic, caring for children, being in debt, or being in a difficult relationship. SIGNS AND SYMPTOMS Stress may cause emotional symptoms including, the following:  Anxiety. This is feeling worried, afraid, on edge, overwhelmed, or out of control.  Anger. This is feeling irritated or impatient.  Depression. This is feeling sad, down, helpless, or guilty.  Difficulty focusing, remembering, or making decisions. Stress may cause physical symptoms, including the following:   Aches and pains. These may affect your head, neck, back, stomach, or other areas of your body.  Tight muscles or clenched jaw.  Low energy or trouble sleeping. Stress may cause unhealthy behaviors, including the following:   Eating to feel  better (overeating) or skipping meals.  Sleeping too little, too much, or both.  Working too much or putting off tasks (procrastination).  Smoking, drinking alcohol, or using drugs to feel better. DIAGNOSIS  Stress is diagnosed through an assessment by your health care provider. Your health care provider will ask questions about your symptoms and any stressful life events.Your health care provider will also ask about your medical history and may order blood tests or other tests. Certain medical conditions and medicine can cause physical symptoms similar to stress. Mental illness can cause emotional symptoms and unhealthy behaviors similar to stress. Your health care provider may refer you to a mental health professional for further evaluation.  TREATMENT  Stress management is the recommended treatment for stress.The goals of stress management are reducing stressful life events and coping with stress in healthy ways.  Techniques for reducing stressful life events include the following:  Stress identification. Self-monitor for stress and identify what causes stress for you. These skills may help you to avoid some stressful events.  Time management. Set your priorities, keep a calendar of events, and learn to say "no." These tools can help you avoid making too many commitments. Techniques for coping with stress include the following:  Rethinking the problem. Try to think realistically about stressful events rather than ignoring them or overreacting. Try to find the positives in a stressful situation rather than focusing on the negatives.  Exercise. Physical exercise can release both physical and emotional tension. The key is to find a form of exercise you enjoy and do it regularly.  Relaxation techniques. These relax the body and mind. Examples include yoga, meditation, tai chi, biofeedback, deep breathing, progressive muscle relaxation, listening to music, being out in nature, journaling, and  other hobbies. Again, the key is to find one or more that you enjoy and can do regularly.  Healthy lifestyle. Eat a balanced diet, get plenty of sleep, and do not smoke. Avoid using alcohol or drugs to relax.  Strong support network. Spend time with family, friends, or other people you enjoy being around.Express your feelings and talk things over with someone you trust. Counseling or talktherapy with a mental health professional may be helpful if you are having difficulty managing stress on your own. Medicine is typically not recommended for the treatment of stress.Talk to your health care provider if you think you need medicine for symptoms of stress. HOME CARE INSTRUCTIONS  Keep all follow-up visits as directed by your health care provider.  Take all medicines as directed by your health care provider. SEEK MEDICAL CARE IF:  Your symptoms get worse or you start having new symptoms.  You feel overwhelmed by your problems and can no longer manage them on your own. SEEK IMMEDIATE MEDICAL CARE IF:  You feel like hurting yourself or someone else. Document Released: 12/11/2000 Document Revised: 11/01/2013 Document Reviewed: 02/09/2013 [facility] Patient Information 2015 [place], [place]. This information is not intended to replace advice given to you by your health care provider. Make sure you discuss any questions you have with your health care provider.

## 2014-02-14 NOTE — Progress Notes (Signed)
Subjective:    Patient ID: Carla Williams, female    DOB: 03-10-1966, 48 y.o.   MRN: [id number]  HPI  48 year old patient history of hypertension and diabetes who complains of increase in anxiety over the past 2 months.  There have been some health concerns in the family, as well as feeling overwhelmed at work.  She describes anxiety and poor sleep habits for the past 8 weeks. No history of depression.  Past Medical History  Diagnosis Date  . GERD (gastroesophageal reflux disease)   . Hyperlipemia   . Diabetes mellitus     History   Social History  . Marital Status: Married    Spouse Name: N/A    Number of Children: 2  . Years of Education: N/A   Occupational History  . Social Worker    Social History Main Topics  . Smoking status: Never Smoker   . Smokeless tobacco: Never Used  . Alcohol Use: No  . Drug Use: No  . Sexual Activity: Not on file   Other Topics Concern  . Not on file   Social History Narrative   One caffeine drink daily     Past Surgical History  Procedure Laterality Date  . Tubal ligation      Family History  Problem Relation Age of Onset  . Colon cancer Neg Hx   . Breast cancer      Maternal Great Aunt   . Diabetes Father     and Mother  . Heart disease Father     No Known Allergies  Current Outpatient Prescriptions on File Prior to Visit  Medication Sig Dispense Refill  . atorvastatin (LIPITOR) 40 MG tablet Take 1 tablet (40 mg total) by mouth daily. One tablet by mouth once daily  90 tablet  3  . benazepril-hydrochlorthiazide (LOTENSIN HCT) 10-12.5 MG per tablet Take 1 tablet by mouth daily.  90 tablet  3  . Canagliflozin (INVOKANA) 300 MG TABS Take 1 tablet (300 mg total) by mouth daily.  90 tablet  6  . esomeprazole (NEXIUM) 40 MG capsule Take 1 capsule (40 mg total) by mouth daily before breakfast. As needed  90 capsule  3  . glimepiride (AMARYL) 4 MG tablet Take 1 tablet (4 mg total) by mouth 2 (two) times daily. One tablet by  mouth once a day  180 tablet  3  . ibuprofen (ADVIL,MOTRIN) 800 MG tablet Take 1 tablet (800 mg total) by mouth as needed for pain.  90 tablet  1  . metFORMIN (GLUCOPHAGE) 1000 MG tablet Take 1 tablet (1,000 mg total) by mouth 2 (two) times daily with a meal.  180 tablet  3  . HYDROcodone-homatropine (HYCODAN) 5-1.5 MG/5ML syrup 1 tsp po qhs prn cough  120 mL  0   No current facility-administered medications on file prior to visit.    BP 136/90  Pulse 74  Temp(Src) 98.1 F (36.7 C) (Oral)  Resp 20  Ht 5\' 5"  (1.651 m)  Wt 226 lb (102.513 kg)  BMI 37.61 kg/m2  SpO2 98%     Review of Systems  Constitutional: Negative.   HENT: Negative for congestion, dental problem, hearing loss, rhinorrhea, sinus pressure, sore throat and tinnitus.   Eyes: Negative for pain, discharge and visual disturbance.  Respiratory: Negative for cough and shortness of breath.   Cardiovascular: Negative for chest pain, palpitations and leg swelling.  Gastrointestinal: Negative for nausea, vomiting, abdominal pain, diarrhea, constipation, blood in stool and abdominal distention.  Genitourinary: Negative for dysuria, urgency, frequency, hematuria, flank pain, vaginal bleeding, vaginal discharge, difficulty urinating, vaginal pain and pelvic pain.  Musculoskeletal: Negative for arthralgias, gait problem and joint swelling.  Skin: Negative for rash.  Neurological: Negative for dizziness, syncope, speech difficulty, weakness, numbness and headaches.  Hematological: Negative for adenopathy.  Psychiatric/Behavioral: Positive for sleep disturbance. Negative for behavioral problems, dysphoric mood and agitation. The patient is nervous/anxious.        Objective:   Physical Exam  Constitutional: She is oriented to person, place, and time. She appears well-developed and well-nourished.  HENT:  Head: Normocephalic.  Right Ear: External ear normal.  Left Ear: External ear normal.  Mouth/Throat: Oropharynx is clear  and moist.  Eyes: Conjunctivae and EOM are normal. Pupils are equal, round, and reactive to light.  Neck: Normal range of motion. Neck supple. No thyromegaly present.  Cardiovascular: Normal rate, regular rhythm, normal heart sounds and intact distal pulses.   Pulmonary/Chest: Effort normal and breath sounds normal.  Abdominal: Soft. Bowel sounds are normal. She exhibits no mass. There is no tenderness.  Musculoskeletal: Normal range of motion.  Lymphadenopathy:    She has no cervical adenopathy.  Neurological: She is alert and oriented to person, place, and time.  Skin: Skin is warm and dry. No rash noted.  Psychiatric: She has a normal mood and affect. Her behavior is normal. Judgment and thought content normal.          Assessment & Plan:   Anxiety disorder.  Will treat with short-term lorazepam.  Behavioral health referral.  Information concerning stress management, dispensed.  Recheck in 4 weeks or as needed Diabetes mellitus.  Followup endocrinology Hypertension stable

## 2014-02-15 ENCOUNTER — Telehealth: Payer: Self-pay | Admitting: Internal Medicine

## 2014-02-15 NOTE — Telephone Encounter (Signed)
Relevant patient education assigned to patient using Emmi. ° °

## 2014-03-09 ENCOUNTER — Other Ambulatory Visit: Payer: Self-pay | Admitting: Internal Medicine

## 2014-03-09 DIAGNOSIS — Z1231 Encounter for screening mammogram for malignant neoplasm of breast: Secondary | ICD-10-CM

## 2014-03-11 ENCOUNTER — Telehealth: Payer: Self-pay | Admitting: *Deleted

## 2014-03-11 NOTE — Telephone Encounter (Signed)
Left message on machine for patient to schedule nurse visit appointment for a BP check

## 2014-03-21 ENCOUNTER — Other Ambulatory Visit: Payer: Self-pay | Admitting: Internal Medicine

## 2014-03-30 ENCOUNTER — Ambulatory Visit (HOSPITAL_COMMUNITY)
Admission: RE | Admit: 2014-03-30 | Discharge: 2014-03-30 | Disposition: A | Discharge status: Home / Self Care | Payer: BC Managed Care – PPO | Source: Ambulatory Visit | Attending: Internal Medicine | Admitting: Internal Medicine

## 2014-03-30 DIAGNOSIS — Z1231 Encounter for screening mammogram for malignant neoplasm of breast: Secondary | ICD-10-CM | POA: Diagnosis not present

## 2014-04-22 ENCOUNTER — Other Ambulatory Visit: Payer: Self-pay | Admitting: Internal Medicine

## 2014-05-12 ENCOUNTER — Other Ambulatory Visit (INDEPENDENT_AMBULATORY_CARE_PROVIDER_SITE_OTHER): Payer: BC Managed Care – PPO

## 2014-05-12 DIAGNOSIS — E119 Type 2 diabetes mellitus without complications: Secondary | ICD-10-CM

## 2014-05-12 LAB — BASIC METABOLIC PANEL
BUN: 10 mg/dL (ref 6–23)
CO2: 20 meq/L (ref 19–32)
Calcium: 9.5 mg/dL (ref 8.4–10.5)
Chloride: 104 mEq/L (ref 96–112)
Creatinine, Ser: 0.7 mg/dL (ref 0.4–1.2)
GFR: 116.29 mL/min (ref >60.00)
GLUCOSE: 139 mg/dL — AB (ref 70–99)
POTASSIUM: 4 meq/L (ref 3.5–5.1)
Sodium: 138 mEq/L (ref 135–145)

## 2014-05-17 ENCOUNTER — Encounter: Payer: Self-pay | Admitting: Endocrinology

## 2014-05-17 ENCOUNTER — Ambulatory Visit (INDEPENDENT_AMBULATORY_CARE_PROVIDER_SITE_OTHER): Payer: BC Managed Care – PPO | Admitting: Endocrinology

## 2014-05-17 VITALS — BP 125/82 | HR 82 | Temp 98.1°F | Resp 16 | Ht 65.0 in | Wt 225.6 lb

## 2014-05-17 DIAGNOSIS — IMO0002 Reserved for concepts with insufficient information to code with codable children: Secondary | ICD-10-CM

## 2014-05-17 DIAGNOSIS — Z23 Encounter for immunization: Secondary | ICD-10-CM

## 2014-05-17 DIAGNOSIS — E1165 Type 2 diabetes mellitus with hyperglycemia: Secondary | ICD-10-CM

## 2014-05-17 LAB — FRUCTOSAMINE: Fructosamine: 304 umol/L — ABNORMAL HIGH (ref 190–270)

## 2014-05-17 MED ORDER — METFORMIN HCL ER 500 MG PO TB24: 2000.0000 mg | ORAL_TABLET | Freq: Every day | ORAL | Status: DC

## 2014-05-17 NOTE — Patient Instructions (Signed)
If not eating breakfast take only 1/2 Metformin

## 2014-05-17 NOTE — Progress Notes (Signed)
Patient ID: Carla Williams, female   DOB: 1965-11-18, 48 y.o.   MRN: [id number]    Reason for Appointment : for Type 2 Diabetes  History of Present Illness          Diagnosis: Type 2 diabetes mellitus, date of diagnosis: 2007      Past history: She was initially told to have mild diabetes and advised to lose weight with diet and exercise. About a year later because of higher blood sugars she was started on metformin low dose and this was increased. However because of lack of adequate control she was given Amaryl in addition soon after She thinks her blood sugars and overall were subsequently controlled with A1c as low as around 6% but records are not available In 2014 blood sugars apparently started increasing significantly. She was initially given Januvia but subsequently in 11/14 she still had a high A1c Previously she thinks she had been somewhat irregular with her medication compliance  Recent history:  She has taken [place] since 11/14 which was started because of a high A1c of 9.1% She is subsequently also has been trying to be more compliant with all her medications Blood sugars may be somewhat better but she has not lost much weight Only recently had mild yeast infection which is improved now However has been checking her blood sugar somewhat irregularly and did not bring any records for review.    She thinks her blood sugars are mostly high after meals       Oral hypoglycemic drugs the patient is taking are: Invokana, Amaryl, metformin      Side effects from medications have been: No significant side effects  Glucose monitoring:  done < one time a day         Glucometer: One Touch .      Blood Glucose readings  <200  Hypoglycemia: None      Glycemic control:   Lab Results  Component Value Date   HGBA1C 7.9* 09/21/2013   HGBA1C 9.1* 05/18/2013   Lab Results  Component Value Date   MICROALBUR 2.3* 05/18/2013   LDLCALC 72 09/21/2013   CREATININE 0.7 05/12/2014     Self-care: The diet that the patient has been following is: tries to limit carbohydrates.      Meals: 2-3 meals per day. Lunch may be larger           Exercise: walking or Zumba          Dietician visit: pending             Compliance with the medical regimen:  Retinal exam: Most recent:    Weight history:  Wt Readings from Last 3 Encounters:  05/17/14 225 lb 9.6 oz (102.331 kg)  02/14/14 226 lb (102.513 kg)  10/07/13 230 lb 3.2 oz (104.418 kg)      Medication List       This list is accurate as of: 05/17/14  3:05 PM.  Always use your most recent med list.               atorvastatin 40 MG tablet  Commonly known as:  LIPITOR  Take 1 tablet (40 mg total) by mouth daily. One tablet by mouth once daily     benazepril-hydrochlorthiazide 10-12.5 MG per tablet  Commonly known as:  LOTENSIN HCT  TAKE 1 TABLET BY MOUTH DAILY.     canagliflozin 300 MG Tabs tablet  Commonly known as:  INVOKANA  Take 1 tablet (300 mg total) by  mouth daily.     esomeprazole 40 MG capsule  Commonly known as:  NEXIUM  Take 1 capsule (40 mg total) by mouth daily before breakfast. As needed     glimepiride 4 MG tablet  Commonly known as:  AMARYL  Take 1 tablet (4 mg total) by mouth 2 (two) times daily. One tablet by mouth once a day     ibuprofen 800 MG tablet  Commonly known as:  ADVIL,MOTRIN  Take 1 tablet (800 mg total) by mouth as needed for pain.     LORazepam 0.5 MG tablet  Commonly known as:  ATIVAN  Take 1 tablet (0.5 mg total) by mouth 2 (two) times daily as needed for anxiety.     metFORMIN 1000 MG tablet  Commonly known as:  GLUCOPHAGE  TAKE 1 TABLET (1,000 MG TOTAL) BY MOUTH 2 (TWO) TIMES DAILY WITH A MEAL.        Allergies: No Known Allergies  Past Medical History  Diagnosis Date  . GERD (gastroesophageal reflux disease)   . Hyperlipemia   . Diabetes mellitus     Past Surgical History  Procedure Laterality Date  . Tubal ligation      Family History   Problem Relation Age of Onset  . Colon cancer Neg Hx   . Breast cancer      Maternal Great Aunt   . Diabetes Father     and Mother  . Heart disease Father     Social History:  reports that she has never smoked. She has never used smokeless tobacco. She reports that she does not drink alcohol or use illicit drugs.    Review of Systems       Lipids: She has been on Lipitor from her previous PCP but not clear of her level of control  Lab Results  Component Value Date   CHOL 143 09/21/2013                    Skin: No rash or any infections     Thyroid:  No  unusual fatigue.     The blood pressure has been controlled with Lotensin HCT over the last couple of years      No swelling of feet.     No shortness of breath on exertion.     Bowel habits: Normal.       No frequency of urination or nocturia       No joint  Pains. She is having pain in her left hand for the last 2-3 months. This is intermittent and sometimes sharp. Not associated with numbness, tingling or pins and needles. May be better with diving, typing and sometimes at night       No  depression          No history of Numbness, tingling or burning in  feet          No periods since 9/14, hot flashes gone now. Not on HRT; has been followed by gynecologist   LABS:  Appointment on 05/12/2014  Component Date Value Ref Range Status  . Sodium 05/12/2014 138  135 - 145 mEq/L Final  . Potassium 05/12/2014 4.0  3.5 - 5.1 mEq/L Final  . Chloride 05/12/2014 104  96 - 112 mEq/L Final  . CO2 05/12/2014 20  19 - 32 mEq/L Final  . Glucose, Bld 05/12/2014 139* 70 - 99 mg/dL Final  . BUN 05/12/2014 10  6 - 23 mg/dL Final  . Creatinine, Ser 05/12/2014  0.7  0.4 - 1.2 mg/dL Final  . Calcium 05/12/2014 9.5  8.4 - 10.5 mg/dL Final  . GFR 05/12/2014 116.29  >60.00 mL/min Final    Physical Examination:  BP 125/82 mmHg  Pulse 82  Temp(Src) 98.1 F (36.7 C)  Resp 16  Ht 5\' 5"  (1.651 m)  Wt 225 lb 9.6 oz (102.331 kg)   BMI 37.54 kg/m2  SpO2 99%  LMP 03/15/2013       ASSESSMENT:  Diabetes type 2, uncontrolled    She appears to have had progression of her diabetes over the last 7 years and appears to still have high readings despite 3 drug regimen of maximum doses Invokana, metformin and Amaryl She also has difficulty losing weight and has a BMI of almost 40 Although she has some room for improvement of her diet and exercise regimen she needs more counseling for meal planning and day-to-day care  Complications: None, needs regular eye exams. There is no evidence of neuropathy or nephropathy  HYPERTENSION: Blood pressure well controlled  Left arm pain. May have carpal tunnel syndrome and she will discuss with PCP  PLAN:  She is a good candidate for a GLP drug for improved diabetes control and weight loss. Discussed with the patient the nature of GLP-1 drugs, the action on various organ systems, how they benefit blood glucose control, as well as the benefit of weight loss and  increase satiety . Explained possible side effects especially nausea and vomiting; discussed safety information in package insert. Specifically we discussed Trulicity and Victoza.  Demonstrated the medication injection device for Trulicity and injection technique to the patient. Discussed injection sites and titration of Trulicity starting with 0.75 mg once a week for 2 weeks and then increasing to 1.5 mg if no symptoms of nausea.  Patient brochure on Trulicity and co-pay card given She will call if she has any difficulties with the medication or insurance coverage especially since Victoza is preferred on her formulary  Also she will need further diabetes education and will refer her for nutritional counseling For now will reduce her Amaryl to 2 mg in the evening and consider stopping the evening dose To continue metformin and Invokana Regular exercise  HYPERLIPIDEMIA: Will have her labs checked as no recent levels  available   Jazzmin Newbold 05/17/2014, 3:05 PM

## 2014-05-18 NOTE — Progress Notes (Signed)
Quick Note:  Please let patient know that the fructosamine test indicates somewhat high readings, needs to check more often and let us know if blood sugars are consistently high ______

## 2014-06-06 ENCOUNTER — Other Ambulatory Visit: Payer: Self-pay | Admitting: Internal Medicine

## 2014-06-14 ENCOUNTER — Encounter: Payer: Self-pay | Admitting: *Deleted

## 2014-06-14 ENCOUNTER — Encounter: Payer: BC Managed Care – PPO | Attending: Endocrinology | Admitting: *Deleted

## 2014-06-14 DIAGNOSIS — Z713 Dietary counseling and surveillance: Secondary | ICD-10-CM | POA: Insufficient documentation

## 2014-06-14 DIAGNOSIS — E119 Type 2 diabetes mellitus without complications: Secondary | ICD-10-CM | POA: Diagnosis not present

## 2014-06-20 NOTE — Patient Instructions (Signed)
Plan:  Aim for 2-3 Carb Choices per meal (30-45 grams) +/- 1 either way  Aim for 0-15 Carbs per snack if hungry  Include protein in moderation with your meals and snacks Consider reading food labels for Total Carbohydrate and Fat Grams of foods Consider  increasing your activity level by walking for 30 minutes daily as tolerated Consider checking BG at alternate times per day as directed by MD  Continue taking medication as directed by MD    

## 2014-06-20 NOTE — Progress Notes (Signed)
Diabetes Self-Management Education    Appt. Start Time: 1530 Appt. End Time: 1700  06/20/2014  Ms. Carla Williams, identified by name and date of birth, is a 48 y.o. female with a diagnosis of Diabetes: Type 2.  Other people present during visit:      ASSESSMENT  Height 5\' 5"  (1.651 m), weight 227 lb 11.2 oz (103.284 kg), last menstrual period 03/15/2013. Body mass index is 37.89 kg/(m^2).  Initial Visit Information:  Are you currently following a meal plan?: No  Are you taking your medications as prescribed?: Yes   How often do you need to have someone help you when you read instructions, pamphlets, or other written materials from your doctor or pharmacy?: 1 - Never   Psychosocial:   Self-management support: [place] office, CDE visits Patient Concerns: Nutrition/Meal planning, Healthy Lifestyle, Glycemic Control Special Needs: None Preferred Learning Style: No preference indicated Learning Readiness: Ready  Complications:   Last HgB A1C per patient/outside source: 7.9 mg/dL How often do you check your blood sugar?: 0 times/day (not testing) (Recommended 1-2 times per day) Postprandial Blood glucose range (mg/dL): 70-129  Diet Intake:  Breakfast: Power pack (vegetables, proteins, dressing) / fruit Lunch: grilled chicken, peppers, onion, steak sauce, baked potatoe, shreded salad  Dinner: [employer] Grilled chicken sandwich, sweet tea Beverage(s): water  Exercise:  Exercise: ADL's  Individualized Plan for Diabetes Self-Management Training:   Learning Objective:  Patient will have a greater understanding of diabetes self-management. Patient education plan per assessed needs and concerns is to attend individual sessions for     Education Topics Reviewed with Patient Today:  Factors that contribute to the development of diabetes, Explored patient's options for treatment of their diabetes Role of diet in the treatment of diabetes and the relationship between the three  main macronutrients and blood glucose level, Food label reading, portion sizes and measuring food., Carbohydrate counting, Information on hints to eating out and maintain blood glucose control. Role of exercise on diabetes management, blood pressure control and cardiac health.   Identified appropriate SMBG and/or A1C goals.   Relationship between chronic complications and blood glucose control Worked with patient to identify barriers to care and solutions   Lifestyle issues that need to be addressed for better diabetes care  PATIENTS GOALS/Plan (Developed by the patient):  Nutrition: General guidelines for healthy choices and portions discussed  Patient Instructions  Plan:  Aim for 2-3 Carb Choices per meal (30-45 grams) +/- 1 either way  Aim for 0-15 Carbs per snack if hungry  Include protein in moderation with your meals and snacks Consider reading food labels for Total Carbohydrate and Fat Grams of foods Consider  increasing your activity level by walking for 30 minutes daily as tolerated Consider checking BG at alternate times per day as directed by MD  Continue taking medication as directed by MD  Expected Outcomes:  Demonstrated interest in learning. Expect positive outcomes  Education material provided: Living Well with Diabetes, A1C conversion sheet, Meal plan card, My Plate and Snack sheet  If problems or questions, patient to contact team via:  Phone  Future DSME appointment: PRN

## 2014-06-21 ENCOUNTER — Other Ambulatory Visit (INDEPENDENT_AMBULATORY_CARE_PROVIDER_SITE_OTHER): Payer: BC Managed Care – PPO

## 2014-06-21 ENCOUNTER — Other Ambulatory Visit: Payer: Self-pay | Admitting: *Deleted

## 2014-06-21 DIAGNOSIS — E119 Type 2 diabetes mellitus without complications: Secondary | ICD-10-CM

## 2014-06-21 LAB — COMPREHENSIVE METABOLIC PANEL
ALT: 36 U/L — ABNORMAL HIGH (ref 0–35)
AST: 25 U/L (ref 0–37)
Albumin: 4 g/dL (ref 3.5–5.2)
Alkaline Phosphatase: 57 U/L (ref 39–117)
BILIRUBIN TOTAL: 0.8 mg/dL (ref 0.2–1.2)
BUN: 13 mg/dL (ref 6–23)
CO2: 25 meq/L (ref 19–32)
CREATININE: 0.7 mg/dL (ref 0.4–1.2)
Calcium: 9 mg/dL (ref 8.4–10.5)
Chloride: 104 mEq/L (ref 96–112)
GFR: 110.67 mL/min (ref >60.00)
GLUCOSE: 134 mg/dL — AB (ref 70–99)
Potassium: 3.5 mEq/L (ref 3.5–5.1)
Sodium: 138 mEq/L (ref 135–145)
Total Protein: 6.9 g/dL (ref 6.0–8.3)

## 2014-06-21 LAB — LIPID PANEL
CHOLESTEROL: 185 mg/dL (ref 0–200)
HDL: 47.7 mg/dL (ref >39.00)
LDL Cholesterol: 110 mg/dL — ABNORMAL HIGH (ref 0–99)
NONHDL: 137.3
Total CHOL/HDL Ratio: 4
Triglycerides: 135 mg/dL (ref 0.0–149.0)
VLDL: 27 mg/dL (ref 0.0–40.0)

## 2014-06-21 LAB — HEMOGLOBIN A1C: HEMOGLOBIN A1C: 8 % — AB (ref 4.6–6.5)

## 2014-06-28 ENCOUNTER — Ambulatory Visit: Payer: BC Managed Care – PPO | Admitting: Endocrinology

## 2014-06-29 ENCOUNTER — Ambulatory Visit (INDEPENDENT_AMBULATORY_CARE_PROVIDER_SITE_OTHER): Payer: BC Managed Care – PPO | Admitting: Endocrinology

## 2014-06-29 ENCOUNTER — Encounter: Payer: Self-pay | Admitting: Endocrinology

## 2014-06-29 ENCOUNTER — Other Ambulatory Visit: Payer: Self-pay | Admitting: *Deleted

## 2014-06-29 VITALS — BP 125/88 | HR 76 | Temp 98.1°F | Resp 14 | Ht 65.0 in | Wt 223.0 lb

## 2014-06-29 DIAGNOSIS — E785 Hyperlipidemia, unspecified: Secondary | ICD-10-CM

## 2014-06-29 DIAGNOSIS — I10 Essential (primary) hypertension: Secondary | ICD-10-CM

## 2014-06-29 DIAGNOSIS — E1165 Type 2 diabetes mellitus with hyperglycemia: Secondary | ICD-10-CM

## 2014-06-29 DIAGNOSIS — IMO0002 Reserved for concepts with insufficient information to code with codable children: Secondary | ICD-10-CM

## 2014-06-29 MED ORDER — LIRAGLUTIDE 18 MG/3ML ~~LOC~~ SOPN: PEN_INJECTOR | SUBCUTANEOUS | Status: DC

## 2014-06-29 MED ORDER — GLUCOSE BLOOD VI STRP: ORAL_STRIP | Status: DC

## 2014-06-29 NOTE — Patient Instructions (Addendum)
Please check blood sugars at least half the time about 2 hours after any meal and times per week on waking up. Please bring blood sugar monitor to each visit  Start VICTOZA with the pen as shown once daily at the same time of the day.  Dial the dose to 0.6 mg on the pen for the first week.  You may inject in the stomach, thigh or arm. You may experience nausea in the first few days which usually gets better.  You will feel fullness of the stomach with starting the medication and should try to keep the portions at meals small. After 1 week increase the dose to 1.2mg  daily if no nausea present.   If any questions or concerns are present call the office or the [facility] helpline at [phone]. Visit [email or web address] for more useful information  Stop Glimeperide in pms  Exercise 3-4x per week  Low saturated fat diet

## 2014-06-29 NOTE — Progress Notes (Signed)
Patient ID: Carla Williams, female   DOB: 1965/07/15, 48 y.o.   MRN: [id number]    Reason for Appointment : for Type 2 Diabetes  History of Present Illness          Diagnosis: Type 2 diabetes mellitus, date of diagnosis: 2007      Past history: She was initially told to have mild diabetes and advised to lose weight with diet and exercise. About a year later because of higher blood sugars she was started on metformin low dose and this was increased. However because of lack of adequate control she was given Amaryl in addition soon after She thinks her blood sugars and overall were subsequently controlled with A1c as low as around 6% but records are not available In 2014 blood sugars apparently started increasing significantly. She was initially given Januvia but subsequently in 11/14 she still had a high A1c Previously she thinks she had been somewhat irregular with her medication compliance  Recent history:  She has taken Invokana since 11/14 which was started because of a high A1c of 9.1% She is also on Amaryl and metformin  She will seen in 11/15 after a several month interval and her fructosamine indicated persistently high readings Since her last visit she has not checked her sugar since her meter had some difficulties with charging Her A1c is still relatively high  She was given information on Trulicity and apparently could not get it because of insurance noncoverage Her weight is stable and she thinks she is doing fairly well with diet but her blood sugars are probably high after meals since A1c is high Lab glucose was 134 fasting       Oral hypoglycemic drugs the patient is taking are: Invokana, Amaryl, metformin      Side effects from medications have been: None   Glucose monitoring:  done < one time a day         Glucometer: One Touch .      Blood Glucose readings  none recently   Hypoglycemia: None      Glycemic control:   Lab Results  Component Value Date   HGBA1C 8.0*  06/21/2014   HGBA1C 7.9* 09/21/2013   HGBA1C 9.1* 05/18/2013   Lab Results  Component Value Date   MICROALBUR 2.3* 05/18/2013   LDLCALC 110* 06/21/2014   CREATININE 0.7 06/21/2014    Self-care: The diet that the patient has been following is: tries to limit carbohydrates.      Meals: 2-3 meals per day. Lunch may be larger           Exercise: walking or Zumba 2/7         Dietician visit:  05/2014              Compliance with the medical regimen:  Retinal exam: Most recent:    Weight history:  Wt Readings from Last 3 Encounters:  06/29/14 223 lb (101.152 kg)  06/14/14 227 lb 11.2 oz (103.284 kg)  05/17/14 225 lb 9.6 oz (102.331 kg)      Medication List       This list is accurate as of: 06/29/14  8:56 AM.  Always use your most recent med list.               atorvastatin 40 MG tablet  Commonly known as:  LIPITOR  Take 1 tablet (40 mg total) by mouth daily. One tablet by mouth once daily     benazepril-hydrochlorthiazide 10-12.5 MG per tablet  Commonly known as:  LOTENSIN HCT  TAKE 1 TABLET BY MOUTH DAILY.     esomeprazole 40 MG capsule  Commonly known as:  NEXIUM  Take 1 capsule (40 mg total) by mouth daily before breakfast. As needed     glimepiride 4 MG tablet  Commonly known as:  AMARYL  Take 1 tablet (4 mg total) by mouth 2 (two) times daily. One tablet by mouth once a day     ibuprofen 800 MG tablet  Commonly known as:  ADVIL,MOTRIN  Take 1 tablet (800 mg total) by mouth as needed for pain.     INVOKANA 300 MG Tabs tablet  Generic drug:  canagliflozin  TAKE 1 TABLET (300 MG TOTAL) BY MOUTH DAILY.     LORazepam 0.5 MG tablet  Commonly known as:  ATIVAN  Take 1 tablet (0.5 mg total) by mouth 2 (two) times daily as needed for anxiety.     metFORMIN 500 MG 24 hr tablet  Commonly known as:  GLUCOPHAGE-XR  Take 4 tablets (2,000 mg total) by mouth daily with supper.        Allergies: No Known Allergies  Past Medical History  Diagnosis Date  .  GERD (gastroesophageal reflux disease)   . Hyperlipemia   . Diabetes mellitus     Past Surgical History  Procedure Laterality Date  . Tubal ligation      Family History  Problem Relation Age of Onset  . Colon cancer Neg Hx   . Breast cancer      Maternal Great Aunt   . Diabetes Father     and Mother  . Heart disease Father     Social History:  reports that she has never smoked. She has never used smokeless tobacco. She reports that she does not drink alcohol or use illicit drugs.    Review of Systems       Lipids: She has been on Lipitor from her previous PCP even though she thinks she has been compliant with the medication and refills her lipids are not well-controlled recently. She has just seen the dietitian couple of weeks ago and thinks she can improve her diet  Lab Results  Component Value Date   CHOL 185 06/21/2014   CHOL 143 09/21/2013   Lab Results  Component Value Date   HDL 47.70 06/21/2014   HDL 55.70 09/21/2013   Lab Results  Component Value Date   LDLCALC 110* 06/21/2014   LDLCALC 72 09/21/2013   Lab Results  Component Value Date   TRIG 135.0 06/21/2014   TRIG 75.0 09/21/2013   Lab Results  Component Value Date   CHOLHDL 4 06/21/2014   CHOLHDL 3 09/21/2013   No results found for: LDLDIRECT                     The blood pressure has been controlled with Lotensin HCT over the last couple of years      LABS:  No visits with results within 1 Week(s) from this visit. Latest known visit with results is:  Lab on 06/21/2014  Component Date Value Ref Range Status  . Hgb A1c MFr Bld 06/21/2014 8.0* 4.6 - 6.5 % Final   Glycemic Control Guidelines for People with Diabetes:Non Diabetic:  <6%Goal of Therapy: <7%Additional Action Suggested:  >8%   . Sodium 06/21/2014 138  135 - 145 mEq/L Final  . Potassium 06/21/2014 3.5  3.5 - 5.1 mEq/L Final  . Chloride 06/21/2014 104  96 - 112  mEq/L Final  . CO2 06/21/2014 25  19 - 32 mEq/L Final  . Glucose,  Bld 06/21/2014 134* 70 - 99 mg/dL Final  . BUN 06/21/2014 13  6 - 23 mg/dL Final  . Creatinine, Ser 06/21/2014 0.7  0.4 - 1.2 mg/dL Final  . Total Bilirubin 06/21/2014 0.8  0.2 - 1.2 mg/dL Final  . Alkaline Phosphatase 06/21/2014 57  39 - 117 U/L Final  . AST 06/21/2014 25  0 - 37 U/L Final  . ALT 06/21/2014 36* 0 - 35 U/L Final  . Total Protein 06/21/2014 6.9  6.0 - 8.3 g/dL Final  . Albumin 06/21/2014 4.0  3.5 - 5.2 g/dL Final  . Calcium 06/21/2014 9.0  8.4 - 10.5 mg/dL Final  . GFR 06/21/2014 110.67  >60.00 mL/min Final  . Cholesterol 06/21/2014 185  0 - 200 mg/dL Final   ATP III Classification       Desirable:  < 200 mg/dL               Borderline High:  200 - 239 mg/dL          High:  > = 240 mg/dL  . Triglycerides 06/21/2014 135.0  0.0 - 149.0 mg/dL Final   Normal:  <150 mg/dLBorderline High:  150 - 199 mg/dL  . HDL 06/21/2014 47.70  >39.00 mg/dL Final  . VLDL 06/21/2014 27.0  0.0 - 40.0 mg/dL Final  . LDL Cholesterol 06/21/2014 110* 0 - 99 mg/dL Final  . Total CHOL/HDL Ratio 06/21/2014 4   Final                  Men          Women1/2 Average Risk     3.4          3.3Average Risk          5.0          4.42X Average Risk          9.6          7.13X Average Risk          15.0          11.0                      . NonHDL 06/21/2014 137.30   Final   NOTE:  Non-HDL goal should be 30 mg/dL higher than patient's LDL goal (i.e. LDL goal of < 70 mg/dL, would have non-HDL goal of < 100 mg/dL)    Physical Examination:  BP 125/88 mmHg  Pulse 76  Temp(Src) 98.1 F (36.7 C)  Resp 14  Ht 5\' 5"  (1.651 m)  Wt 223 lb (101.152 kg)  BMI 37.11 kg/m2  SpO2 99%  LMP 03/15/2013   No pedal edema    ASSESSMENT:  Diabetes type 2, uncontrolled    She appears to have  persistently high A1c with current regimen  of maximum doses Invokana, metformin and Amaryl She also has difficulty losing weight and has a BMI of  over 35 Currently not monitoring her blood sugar and previously checking readings  mostly in the mornings She has recently seen the dietitian but is only losing 2 pounds this month Exercising only twice a week, needs to do better  HYPERLIPIDEMIA: Her LDL is over 100 despite previous good levels and reportedly good compliance with Lipitor   PLAN:  She is a good candidate for a GLP drug for improved diabetes control and weight loss. Discussed  with the patient the nature of GLP-1 drugs, the action on various organ systems, how they benefit blood glucose control, as well as the benefit of weight loss and  increase satiety . Explained possible side effects especially nausea and vomiting; discussed safety information in package insert including precautions and warnings.  Discussed dosage titration Use of the Victoza injection device was demonstrated to the patient by the nurse educator and discussed She was given a new One Touch Verio monitor For now will reduce her Amaryl to once a day in the morning and consider adjusting the morning doses accordingly also To continue metformin and Invokana Regular exercise  HYPERLIPIDEMIA: Will have her improve her diet and consider changing her medication or using Zetia  Counseling time over 50% of today's 25 minute visit   Evaleigh Mccamy 06/29/2014, 8:56 AM

## 2014-07-08 ENCOUNTER — Other Ambulatory Visit: Payer: Self-pay | Admitting: *Deleted

## 2014-07-08 ENCOUNTER — Telehealth: Payer: Self-pay | Admitting: Endocrinology

## 2014-07-08 MED ORDER — INSULIN PEN NEEDLE 32G X 5 MM MISC: Status: AC

## 2014-07-08 NOTE — Telephone Encounter (Signed)
Patient stated that she need a prescription for pen needles for her Victoza.

## 2014-07-08 NOTE — Telephone Encounter (Signed)
rx sent for pen needles

## 2014-07-09 ENCOUNTER — Other Ambulatory Visit: Payer: Self-pay | Admitting: Internal Medicine

## 2014-07-10 LAB — HM DIABETES EYE EXAM

## 2014-07-12 ENCOUNTER — Other Ambulatory Visit: Payer: Self-pay | Admitting: Internal Medicine

## 2014-07-12 ENCOUNTER — Other Ambulatory Visit (INDEPENDENT_AMBULATORY_CARE_PROVIDER_SITE_OTHER): Payer: BLUE CROSS/BLUE SHIELD

## 2014-07-12 DIAGNOSIS — Z0000 Encounter for general adult medical examination without abnormal findings: Secondary | ICD-10-CM

## 2014-07-12 LAB — CBC WITH DIFFERENTIAL/PLATELET
BASOS ABS: 0 10*3/uL (ref 0.0–0.1)
BASOS PCT: 0.5 % (ref 0.0–3.0)
Eosinophils Absolute: 0.1 10*3/uL (ref 0.0–0.7)
Eosinophils Relative: 1.7 % (ref 0.0–5.0)
HCT: 40.2 % (ref 36.0–46.0)
HEMOGLOBIN: 13.4 g/dL (ref 12.0–15.0)
LYMPHS PCT: 49.7 % — AB (ref 12.0–46.0)
Lymphs Abs: 2.1 10*3/uL (ref 0.7–4.0)
MCHC: 33.2 g/dL (ref 30.0–36.0)
MCV: 91.3 fl (ref 78.0–100.0)
Monocytes Absolute: 0.4 10*3/uL (ref 0.1–1.0)
Monocytes Relative: 8.4 % (ref 3.0–12.0)
NEUTROS ABS: 1.7 10*3/uL (ref 1.4–7.7)
Neutrophils Relative %: 39.7 % — ABNORMAL LOW (ref 43.0–77.0)
Platelets: 261 10*3/uL (ref 150.0–400.0)
RBC: 4.4 Mil/uL (ref 3.87–5.11)
RDW: 13.2 % (ref 11.5–15.5)
WBC: 4.2 10*3/uL (ref 4.0–10.5)

## 2014-07-12 LAB — COMPREHENSIVE METABOLIC PANEL WITH GFR
ALT: 56 U/L — ABNORMAL HIGH (ref 0–35)
AST: 38 U/L — ABNORMAL HIGH (ref 0–37)
Albumin: 4.1 g/dL (ref 3.5–5.2)
Alkaline Phosphatase: 48 U/L (ref 39–117)
BUN: 11 mg/dL (ref 6–23)
CO2: 25 meq/L (ref 19–32)
Calcium: 9 mg/dL (ref 8.4–10.5)
Chloride: 107 meq/L (ref 96–112)
Creatinine, Ser: 0.8 mg/dL (ref 0.4–1.2)
GFR: 103.95 mL/min
Glucose, Bld: 126 mg/dL — ABNORMAL HIGH (ref 70–99)
Potassium: 3.6 meq/L (ref 3.5–5.1)
Sodium: 139 meq/L (ref 135–145)
Total Bilirubin: 0.9 mg/dL (ref 0.2–1.2)
Total Protein: 7.1 g/dL (ref 6.0–8.3)

## 2014-07-12 LAB — LIPID PANEL
Cholesterol: 178 mg/dL (ref 0–200)
HDL: 49.6 mg/dL
LDL Cholesterol: 114 mg/dL — ABNORMAL HIGH (ref 0–99)
NonHDL: 128.4
Total CHOL/HDL Ratio: 4
Triglycerides: 74 mg/dL (ref 0.0–149.0)
VLDL: 14.8 mg/dL (ref 0.0–40.0)

## 2014-07-12 LAB — TSH: TSH: 1.04 u[IU]/mL (ref 0.35–4.50)

## 2014-07-12 LAB — HEMOGLOBIN A1C: Hgb A1c MFr Bld: 8 % — ABNORMAL HIGH (ref 4.6–6.5)

## 2014-07-13 ENCOUNTER — Other Ambulatory Visit: Payer: Self-pay | Admitting: Internal Medicine

## 2014-07-14 ENCOUNTER — Other Ambulatory Visit: Payer: Self-pay | Admitting: *Deleted

## 2014-07-14 MED ORDER — GLIMEPIRIDE 4 MG PO TABS: 4.0000 mg | ORAL_TABLET | Freq: Two times a day (BID) | ORAL | Status: DC

## 2014-07-23 ENCOUNTER — Other Ambulatory Visit: Payer: Self-pay | Admitting: Internal Medicine

## 2014-07-25 ENCOUNTER — Encounter: Payer: Self-pay | Admitting: Internal Medicine

## 2014-07-26 ENCOUNTER — Encounter: Payer: Self-pay | Admitting: *Deleted

## 2014-07-26 ENCOUNTER — Encounter: Payer: Self-pay | Admitting: Internal Medicine

## 2014-07-26 ENCOUNTER — Ambulatory Visit (INDEPENDENT_AMBULATORY_CARE_PROVIDER_SITE_OTHER): Payer: BLUE CROSS/BLUE SHIELD | Admitting: Internal Medicine

## 2014-07-26 VITALS — BP 130/90 | HR 92 | Temp 98.0°F | Resp 20 | Ht 65.0 in | Wt 226.0 lb

## 2014-07-26 DIAGNOSIS — E119 Type 2 diabetes mellitus without complications: Secondary | ICD-10-CM

## 2014-07-26 DIAGNOSIS — I10 Essential (primary) hypertension: Secondary | ICD-10-CM

## 2014-07-26 DIAGNOSIS — E785 Hyperlipidemia, unspecified: Secondary | ICD-10-CM

## 2014-07-26 DIAGNOSIS — Z0000 Encounter for general adult medical examination without abnormal findings: Secondary | ICD-10-CM

## 2014-07-26 MED ORDER — LORAZEPAM 0.5 MG PO TABS: 0.5000 mg | ORAL_TABLET | Freq: Two times a day (BID) | ORAL | Status: DC | PRN

## 2014-07-26 MED ORDER — IBUPROFEN 800 MG PO TABS: 800.0000 mg | ORAL_TABLET | ORAL | Status: DC | PRN

## 2014-07-26 MED ORDER — ESOMEPRAZOLE MAGNESIUM 40 MG PO CPDR: 40.0000 mg | DELAYED_RELEASE_CAPSULE | Freq: Every day | ORAL | Status: DC

## 2014-07-26 NOTE — Patient Instructions (Signed)
Limit your sodium (Salt) intake    It is important that you exercise regularly, at least 20 minutes 3 to 4 times per week.  If you develop chest pain or shortness of breath seek  medical attention.  You need to lose weight.  Consider a lower calorie diet and regular exercise.  Please check your blood pressure on a regular basis.  If it is consistently greater than 150/90, please make an office appointment.  Health Maintenance Adopting a healthy lifestyle and getting preventive care can go a long way to promote health and wellness. Talk with your health care provider about what schedule of regular examinations is right for you. This is a good chance for you to check in with your provider about disease prevention and staying healthy. In between checkups, there are plenty of things you can do on your own. Experts have done a lot of research about which lifestyle changes and preventive measures are most likely to keep you healthy. Ask your health care provider for more information. WEIGHT AND DIET  Eat a healthy diet  Be sure to include plenty of vegetables, fruits, low-fat dairy products, and lean protein.  Do not eat a lot of foods high in solid fats, added sugars, or salt.  Get regular exercise. This is one of the most important things you can do for your health.  Most adults should exercise for at least 150 minutes each week. The exercise should increase your heart rate and make you sweat (moderate-intensity exercise).  Most adults should also do strengthening exercises at least twice a week. This is in addition to the moderate-intensity exercise.  Maintain a healthy weight  Body mass index (BMI) is a measurement that can be used to identify possible weight problems. It estimates body fat based on height and weight. Your health care provider can help determine your BMI and help you achieve or maintain a healthy weight.  For females 20 years of age and older:   A BMI below 18.5 is  considered underweight.  A BMI of 18.5 to 24.9 is normal.  A BMI of 25 to 29.9 is considered overweight.  A BMI of 30 and above is considered obese.  Watch levels of cholesterol and blood lipids  You should start having your blood tested for lipids and cholesterol at 50 years of age, then have this test every 5 years.  You may need to have your cholesterol levels checked more often if:  Your lipid or cholesterol levels are high.  You are older than 50 years of age.  You are at high risk for heart disease.  CANCER SCREENING   Lung Cancer  Lung cancer screening is recommended for adults 55-80 years old who are at high risk for lung cancer because of a history of smoking.  A yearly low-dose CT scan of the lungs is recommended for people who:  Currently smoke.  Have quit within the past 15 years.  Have at least a 30-pack-year history of smoking. A pack year is smoking an average of one pack of cigarettes a day for 1 year.  Yearly screening should continue until it has been 15 years since you quit.  Yearly screening should stop if you develop a health problem that would prevent you from having lung cancer treatment.  Breast Cancer  Practice breast self-awareness. This means understanding how your breasts normally appear and feel.  It also means doing regular breast self-exams. Let your health care provider know about any changes, no matter   how small.  If you are in your 20s or 30s, you should have a clinical breast exam (CBE) by a health care provider every 1-3 years as part of a regular health exam.  If you are 51 or older, have a CBE every year. Also consider having a breast X-ray (mammogram) every year.  If you have a family history of breast cancer, talk to your health care provider about genetic screening.  If you are at high risk for breast cancer, talk to your health care provider about having an MRI and a mammogram every year.  Breast cancer gene (BRCA)  assessment is recommended for women who have family members with BRCA-related cancers. BRCA-related cancers include:  Breast.  Ovarian.  Tubal.  Peritoneal cancers.  Results of the assessment will determine the need for genetic counseling and BRCA1 and BRCA2 testing. Cervical Cancer Routine pelvic examinations to screen for cervical cancer are no longer recommended for nonpregnant women who are considered low risk for cancer of the pelvic organs (ovaries, uterus, and vagina) and who do not have symptoms. A pelvic examination may be necessary if you have symptoms including those associated with pelvic infections. Ask your health care provider if a screening pelvic exam is right for you.   The Pap test is the screening test for cervical cancer for women who are considered at risk.  If you had a hysterectomy for a problem that was not cancer or a condition that could lead to cancer, then you no longer need Pap tests.  If you are older than 65 years, and you have had normal Pap tests for the past 10 years, you no longer need to have Pap tests.  If you have had past treatment for cervical cancer or a condition that could lead to cancer, you need Pap tests and screening for cancer for at least 20 years after your treatment.  If you no longer get a Pap test, assess your risk factors if they change (such as having a new sexual partner). This can affect whether you should start being screened again.  Some women have medical problems that increase their chance of getting cervical cancer. If this is the case for you, your health care provider may recommend more frequent screening and Pap tests.  The human papillomavirus (HPV) test is another test that may be used for cervical cancer screening. The HPV test looks for the virus that can cause cell changes in the cervix. The cells collected during the Pap test can be tested for HPV.  The HPV test can be used to screen women 84 years of age and older.  Getting tested for HPV can extend the interval between normal Pap tests from three to five years.  An HPV test also should be used to screen women of any age who have unclear Pap test results.  After 50 years of age, women should have HPV testing as often as Pap tests.  Colorectal Cancer  This type of cancer can be detected and often prevented.  Routine colorectal cancer screening usually begins at 50 years of age and continues through 50 years of age.  Your health care provider may recommend screening at an earlier age if you have risk factors for colon cancer.  Your health care provider may also recommend using home test kits to check for hidden blood in the stool.  A small camera at the end of a tube can be used to examine your colon directly (sigmoidoscopy or colonoscopy). This is done  to check for the earliest forms of colorectal cancer.  Routine screening usually begins at age 50.  Direct examination of the colon should be repeated every 5-10 years through 50 years of age. However, you may need to be screened more often if early forms of precancerous polyps or small growths are found. Skin Cancer  Check your skin from head to toe regularly.  Tell your health care provider about any new moles or changes in moles, especially if there is a change in a mole's shape or color.  Also tell your health care provider if you have a mole that is larger than the size of a pencil eraser.  Always use sunscreen. Apply sunscreen liberally and repeatedly throughout the day.  Protect yourself by wearing long sleeves, pants, a wide-brimmed hat, and sunglasses whenever you are outside. HEART DISEASE, DIABETES, AND HIGH BLOOD PRESSURE   Have your blood pressure checked at least every 1-2 years. High blood pressure causes heart disease and increases the risk of stroke.  If you are between 55 years and 79 years old, ask your health care provider if you should take aspirin to prevent  strokes.  Have regular diabetes screenings. This involves taking a blood sample to check your fasting blood sugar level.  If you are at a normal weight and have a low risk for diabetes, have this test once every three years after 50 years of age.  If you are overweight and have a high risk for diabetes, consider being tested at a younger age or more often. PREVENTING INFECTION  Hepatitis B  If you have a higher risk for hepatitis B, you should be screened for this virus. You are considered at high risk for hepatitis B if:  You were born in a country where hepatitis B is common. Ask your health care provider which countries are considered high risk.  Your parents were born in a high-risk country, and you have not been immunized against hepatitis B (hepatitis B vaccine).  You have HIV or AIDS.  You use needles to inject street drugs.  You live with someone who has hepatitis B.  You have had sex with someone who has hepatitis B.  You get hemodialysis treatment.  You take certain medicines for conditions, including cancer, organ transplantation, and autoimmune conditions. Hepatitis C  Blood testing is recommended for:  Everyone born from 1945 through 1965.  Anyone with known risk factors for hepatitis C. Sexually transmitted infections (STIs)  You should be screened for sexually transmitted infections (STIs) including gonorrhea and chlamydia if:  You are sexually active and are younger than 50 years of age.  You are older than 50 years of age and your health care provider tells you that you are at risk for this type of infection.  Your sexual activity has changed since you were last screened and you are at an increased risk for chlamydia or gonorrhea. Ask your health care provider if you are at risk.  If you do not have HIV, but are at risk, it may be recommended that you take a prescription medicine daily to prevent HIV infection. This is called pre-exposure prophylaxis  (PrEP). You are considered at risk if:  You are sexually active and do not regularly use condoms or know the HIV status of your partner(s).  You take drugs by injection.  You are sexually active with a partner who has HIV. Talk with your health care provider about whether you are at high risk of being infected with   HIV. If you choose to begin PrEP, you should first be tested for HIV. You should then be tested every 3 months for as long as you are taking PrEP.  PREGNANCY   If you are premenopausal and you may become pregnant, ask your health care provider about preconception counseling.  If you may become pregnant, take 400 to 800 micrograms (mcg) of folic acid every day.  If you want to prevent pregnancy, talk to your health care provider about birth control (contraception). OSTEOPOROSIS AND MENOPAUSE   Osteoporosis is a disease in which the bones lose minerals and strength with aging. This can result in serious bone fractures. Your risk for osteoporosis can be identified using a bone density scan.  If you are 65 years of age or older, or if you are at risk for osteoporosis and fractures, ask your health care provider if you should be screened.  Ask your health care provider whether you should take a calcium or vitamin D supplement to lower your risk for osteoporosis.  Menopause may have certain physical symptoms and risks.  Hormone replacement therapy may reduce some of these symptoms and risks. Talk to your health care provider about whether hormone replacement therapy is right for you.  HOME CARE INSTRUCTIONS   Schedule regular health, dental, and eye exams.  Stay current with your immunizations.   Do not use any tobacco products including cigarettes, chewing tobacco, or electronic cigarettes.  If you are pregnant, do not drink alcohol.  If you are breastfeeding, limit how much and how often you drink alcohol.  Limit alcohol intake to no more than 1 drink per day for  nonpregnant women. One drink equals 12 ounces of beer, 5 ounces of wine, or 1 ounces of hard liquor.  Do not use street drugs.  Do not share needles.  Ask your health care provider for help if you need support or information about quitting drugs.  Tell your health care provider if you often feel depressed.  Tell your health care provider if you have ever been abused or do not feel safe at home. Document Released: 12/31/2010 Document Revised: 11/01/2013 Document Reviewed: 05/19/2013 [employer] Patient Information 2015 [employer]. This information is not intended to replace advice given to you by your health care provider. Make sure you discuss any questions you have with your health care provider.  

## 2014-07-26 NOTE — Progress Notes (Signed)
Subjective:    Patient ID: Carla Williams, female    DOB: [birth year], 49 y.o.   MRN: [id number]  HPI   Subjective:    Patient ID: Carla Williams, female    DOB: 20-Dec-1965, 49 y.o.   MRN: [id number]  HPI  49 year-old patient who is seen today for a preventive health examination. She has history of type 2 diabetes diagnosed in 2007. She is followed by endocrinology and recently has been placed on Victoza.  She has treated hypertension and is on statin therapy.  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 [place] resident her entire life nonsmoker married  Family history father age 75 history coronary artery disease and MI. Status post CABG history of type 2 diabetes Mother age 41 with a history of a cerebral aneurysm also has type 2 diabetes and COPD One brother one sister in good health Brother with  impaired hearing    Past Medical History  Diagnosis Date  . GERD (gastroesophageal reflux disease)   . Hyperlipemia   . Diabetes mellitus     History   Social History  . Marital Status: Married    Spouse Name: N/A    Number of Children: 2  . Years of Education: N/A   Occupational History  . Social Worker    Social History Main Topics  . Smoking status: Never Smoker   . Smokeless tobacco: Never Used  . Alcohol Use: No  . Drug Use: No  . Sexual Activity: Not on file   Other Topics Concern  . Not on file   Social History Narrative   One caffeine drink daily     Past Surgical History  Procedure Laterality Date  . Tubal ligation      Family History  Problem Relation Age of Onset  . Colon cancer Neg Hx   . Breast cancer      Maternal Great Aunt   . Diabetes Father     and Mother  . Heart disease Father     No Known Allergies  Current Outpatient Prescriptions on File Prior to Visit  Medication Sig Dispense Refill  . atorvastatin (LIPITOR) 40 MG  tablet TAKE 1 TABLET (40 MG TOTAL) BY MOUTH DAILY. ONE TABLET BY MOUTH ONCE DAILY 90 tablet 1  . benazepril-hydrochlorthiazide (LOTENSIN HCT) 10-12.5 MG per tablet TAKE 1 TABLET BY MOUTH DAILY. 90 tablet 1  . glimepiride (AMARYL) 4 MG tablet Take 1 tablet (4 mg total) by mouth 2 (two) times daily. 180 tablet 1  . glucose blood (ONETOUCH VERIO) test strip Use as instructed to check blood sugar 2 times per day dx code E11.9 100 each 3  . Insulin Pen Needle (NOVOTWIST) 32G X 5 MM MISC Use one per day to inject Victoza 50 each 3  . INVOKANA 300 MG TABS tablet TAKE 1 TABLET (300 MG TOTAL) BY MOUTH DAILY. 90 tablet 0  . Liraglutide (VICTOZA) 18 MG/3ML SOPN Start off with 0.6mg  daily, after one week if no nausea or vomiting increase dose to 1.2 mg daily 2 pen 3  . LORazepam (ATIVAN) 0.5 MG tablet Take 1 tablet (0.5 mg total) by mouth 2 (two) times daily as needed for anxiety. 60 tablet 1  . metFORMIN (GLUCOPHAGE-XR) 500 MG 24 hr tablet Take 4 tablets (2,000 mg total) by mouth daily with supper. 120 tablet 3  No current facility-administered medications on file prior to visit.    BP 130/90 mmHg  Pulse 92  Temp(Src) 98 F (36.7 C) (Oral)  Resp 20  Ht 5\' 5"  (1.651 m)  Wt 226 lb (102.513 kg)  BMI 37.61 kg/m2  SpO2 96%  LMP 03/15/2013     Review of Systems  Constitutional: Negative.   HENT: Negative for hearing loss, congestion, sore throat, rhinorrhea, dental problem, sinus pressure and tinnitus.   Eyes: Negative for pain, discharge and visual disturbance.  Respiratory: Negative for cough and shortness of breath.   Cardiovascular: Negative for chest pain, palpitations and leg swelling.  Gastrointestinal: Negative for nausea, vomiting, abdominal pain, diarrhea, constipation, blood in stool and abdominal distention.  Genitourinary: Negative for dysuria, urgency, frequency, hematuria, flank pain, vaginal bleeding, vaginal discharge, difficulty urinating, vaginal pain and pelvic pain.    Musculoskeletal: Negative for joint swelling, arthralgias and gait problem.  Skin: Negative for rash.  Neurological: Negative for dizziness, syncope, speech difficulty, weakness, numbness and headaches.  Hematological: Negative for adenopathy.  Psychiatric/Behavioral: Negative for behavioral problems, dysphoric mood and agitation. The patient is nervous/anxious.        Objective:   Physical Exam  Constitutional: She is oriented to person, place, and time. She appears well-developed and well-nourished.  HENT:  Head: Normocephalic.  Right Ear: External ear normal.  Left Ear: External ear normal.  Mouth/Throat: Oropharynx is clear and moist.  Eyes: Conjunctivae and EOM are normal. Pupils are equal, round, and reactive to light.  Neck: Normal range of motion. Neck supple. No thyromegaly present.  Cardiovascular: Normal rate, regular rhythm, normal heart sounds and intact distal pulses.   Pulmonary/Chest: Effort normal and breath sounds normal.  Abdominal: Soft. Bowel sounds are normal. She exhibits no mass. There is no tenderness.  Musculoskeletal: Normal range of motion.  Lymphadenopathy:    She has no cervical adenopathy.  Neurological: She is alert and oriented to person, place, and time.  Skin: Skin is warm and dry. No rash noted.  Psychiatric: She has a normal mood and affect. Her behavior is normal.          Assessment & Plan:   Preventive health exam Diabetes mellitus.  Follow-up endocrinology.  Patient has had recent eye exam Hypertension, well-controlled  Obesity.  Efforts at weight loss encouraged Dyslipidemia.  Continue atorvastatin   Review of Systems  Respiratory:       History of loud snoring but no daytime sleepiness or napping      as above Objective:   Physical Exam  HENT:  Pharyngeal crowding    As above      Assessment & Plan:  As above

## 2014-07-26 NOTE — Progress Notes (Signed)
Pre visit review using our clinic review tool, if applicable. No additional management support is needed unless otherwise documented below in the visit note. 

## 2014-08-01 ENCOUNTER — Ambulatory Visit: Payer: BC Managed Care – PPO | Admitting: Endocrinology

## 2014-08-11 ENCOUNTER — Encounter: Payer: Self-pay | Admitting: Endocrinology

## 2014-08-11 ENCOUNTER — Ambulatory Visit (INDEPENDENT_AMBULATORY_CARE_PROVIDER_SITE_OTHER): Payer: BLUE CROSS/BLUE SHIELD | Admitting: Endocrinology

## 2014-08-11 VITALS — BP 132/94 | HR 75 | Temp 98.1°F | Resp 16 | Ht 65.0 in | Wt 227.6 lb

## 2014-08-11 DIAGNOSIS — I10 Essential (primary) hypertension: Secondary | ICD-10-CM

## 2014-08-11 DIAGNOSIS — E785 Hyperlipidemia, unspecified: Secondary | ICD-10-CM

## 2014-08-11 DIAGNOSIS — IMO0002 Reserved for concepts with insufficient information to code with codable children: Secondary | ICD-10-CM

## 2014-08-11 DIAGNOSIS — E1165 Type 2 diabetes mellitus with hyperglycemia: Secondary | ICD-10-CM

## 2014-08-11 NOTE — Progress Notes (Signed)
Patient ID: Carla Williams, female   DOB: December 04, 1965, 49 y.o.   MRN: [id number]    Reason for Appointment : for Type 2 Diabetes  History of Present Illness          Diagnosis: Type 2 diabetes mellitus, date of diagnosis: 2007      Past history: She was initially told to have mild diabetes and advised to lose weight with diet and exercise. About a year later because of higher blood sugars she was started on metformin low dose and this was increased. However because of lack of adequate control she was given Amaryl in addition soon after She thinks her blood sugars and overall were subsequently controlled with A1c as low as around 6% but records are not available In 2014 blood sugars apparently started increasing significantly. She was initially given Januvia but subsequently in 11/14 she still had a high A1c She has taken Invokana since 11/14 which was started because of a high A1c of 9.1%  Recent history:   She was started on Victoza in 12/15 because of persistently high A1c of 8%. She has titrated this to 1.2 mg without any side effects and is taking this at night.  Initially had mild eructation Trulicity was not covered by her insurance He does help her control her portions and her blood sugars appear to be improving significantly She is also on Amaryl and metformin. However not clear why she has not lost any weight Home glucose monitoring: She is doing this primarily in the mornings and only occasionally later in the day. Her blood sugars appear to be somewhat erratic at all times and has occasional high readings; not clear why her blood sugars were high both fasting and after dinner last evening; she did however not watch her diet yesterday Also has had some sporadic hypoglycemia with lowest glucose 2 days ago in the morning of 51, twice in the morning and once in the afternoon        Oral hypoglycemic drugs the patient is taking are: Invokana, Amaryl, metformin      Side effects from  medications have been: None   Glucose monitoring:  done < one time a day         Glucometer: One Touch .      Blood Glucose readings    PRE-MEAL Breakfast Lunch  8 PM   PCS  Overall  Glucose range:  51-174   70-176   71   83-239  51-239   Mean/median:  108     103    Glycemic control:   Lab Results  Component Value Date   HGBA1C 8.0* 07/12/2014   HGBA1C 8.0* 06/21/2014   HGBA1C 7.9* 09/21/2013   Lab Results  Component Value Date   MICROALBUR 2.3* 05/18/2013   LDLCALC 114* 07/12/2014   CREATININE 0.8 07/12/2014    Self-care: The diet that the patient has been following is: tries to limit carbohydrates more.      Meals: 2-3 meals per day. Lunch may be larger           Exercise: walking or Zumba 2/7         Dietician visit:  05/2014               Retinal exam: Most recent:    Weight history:  Wt Readings from Last 3 Encounters:  08/11/14 227 lb 9.6 oz (103.239 kg)  07/26/14 226 lb (102.513 kg)  06/29/14 223 lb (101.152 kg)  Medication List       This list is accurate as of: 08/11/14 12:04 PM.  Always use your most recent med list.               atorvastatin 40 MG tablet  Commonly known as:  LIPITOR  TAKE 1 TABLET (40 MG TOTAL) BY MOUTH DAILY. ONE TABLET BY MOUTH ONCE DAILY     benazepril-hydrochlorthiazide 10-12.5 MG per tablet  Commonly known as:  LOTENSIN HCT  TAKE 1 TABLET BY MOUTH DAILY.     esomeprazole 40 MG capsule  Commonly known as:  NEXIUM  Take 1 capsule (40 mg total) by mouth daily before breakfast. As needed     glimepiride 4 MG tablet  Commonly known as:  AMARYL  Take 1 tablet (4 mg total) by mouth 2 (two) times daily.     glucose blood test strip  Commonly known as:  ONETOUCH VERIO  Use as instructed to check blood sugar 2 times per day dx code E11.9     ibuprofen 800 MG tablet  Commonly known as:  ADVIL,MOTRIN  Take 1 tablet (800 mg total) by mouth as needed.     Insulin Pen Needle 32G X 5 MM Misc  Commonly known as:   NOVOTWIST  Use one per day to inject Victoza     INVOKANA 300 MG Tabs tablet  Generic drug:  canagliflozin  TAKE 1 TABLET (300 MG TOTAL) BY MOUTH DAILY.     Liraglutide 18 MG/3ML Sopn  Commonly known as:  [employer] off with 0.6mg  daily, after one week if no nausea or vomiting increase dose to 1.2 mg daily     LORazepam 0.5 MG tablet  Commonly known as:  ATIVAN  Take 1 tablet (0.5 mg total) by mouth 2 (two) times daily as needed for anxiety.     metFORMIN 500 MG 24 hr tablet  Commonly known as:  GLUCOPHAGE-XR  Take 4 tablets (2,000 mg total) by mouth daily with supper.        Allergies: No Known Allergies  Past Medical History  Diagnosis Date  . GERD (gastroesophageal reflux disease)   . Hyperlipemia   . Diabetes mellitus     Past Surgical History  Procedure Laterality Date  . Tubal ligation      Family History  Problem Relation Age of Onset  . Colon cancer Neg Hx   . Breast cancer      Maternal Great Aunt   . Diabetes Father     and Mother  . Heart disease Father     Social History:  reports that she has never smoked. She has never used smokeless tobacco. She reports that she does not drink alcohol or use illicit drugs.    Review of Systems       Lipids: She has been on Lipitor from her previous PCP even though she thinks she has been compliant with the medication and refills her lipids are not well-controlled recently. She has just seen the dietitian couple of weeks ago and thinks she can improve her diet  Lab Results  Component Value Date   CHOL 178 07/12/2014   CHOL 185 06/21/2014   CHOL 143 09/21/2013   Lab Results  Component Value Date   HDL 49.60 07/12/2014   HDL 47.70 06/21/2014   HDL 55.70 09/21/2013   Lab Results  Component Value Date   LDLCALC 114* 07/12/2014   LDLCALC 110* 06/21/2014   LDLCALC 72 09/21/2013   Lab Results  Component Value Date   TRIG 74.0 07/12/2014   TRIG 135.0 06/21/2014   TRIG 75.0 09/21/2013   Lab  Results  Component Value Date   CHOLHDL 4 07/12/2014   CHOLHDL 4 06/21/2014   CHOLHDL 3 09/21/2013   No results found for: LDLDIRECT                     The blood pressure has been controlled with Lotensin HCT over the last couple of years      LABS:  No visits with results within 1 Week(s) from this visit. Latest known visit with results is:  Lab on 07/12/2014  Component Date Value Ref Range Status  . WBC 07/12/2014 4.2  4.0 - 10.5 K/uL Final  . RBC 07/12/2014 4.40  3.87 - 5.11 Mil/uL Final  . Hemoglobin 07/12/2014 13.4  12.0 - 15.0 g/dL Final  . HCT 07/12/2014 40.2  36.0 - 46.0 % Final  . MCV 07/12/2014 91.3  78.0 - 100.0 fl Final  . MCHC 07/12/2014 33.2  30.0 - 36.0 g/dL Final  . RDW 07/12/2014 13.2  11.5 - 15.5 % Final  . Platelets 07/12/2014 261.0  150.0 - 400.0 K/uL Final  . Neutrophils Relative % 07/12/2014 39.7* 43.0 - 77.0 % Final  . Lymphocytes Relative 07/12/2014 49.7* 12.0 - 46.0 % Final  . Monocytes Relative 07/12/2014 8.4  3.0 - 12.0 % Final  . Eosinophils Relative 07/12/2014 1.7  0.0 - 5.0 % Final  . Basophils Relative 07/12/2014 0.5  0.0 - 3.0 % Final  . Neutro Abs 07/12/2014 1.7  1.4 - 7.7 K/uL Final  . Lymphs Abs 07/12/2014 2.1  0.7 - 4.0 K/uL Final  . Monocytes Absolute 07/12/2014 0.4  0.1 - 1.0 K/uL Final  . Eosinophils Absolute 07/12/2014 0.1  0.0 - 0.7 K/uL Final  . Basophils Absolute 07/12/2014 0.0  0.0 - 0.1 K/uL Final  . Sodium 07/12/2014 139  135 - 145 mEq/L Final  . Potassium 07/12/2014 3.6  3.5 - 5.1 mEq/L Final  . Chloride 07/12/2014 107  96 - 112 mEq/L Final  . CO2 07/12/2014 25  19 - 32 mEq/L Final  . Glucose, Bld 07/12/2014 126* 70 - 99 mg/dL Final  . BUN 07/12/2014 11  6 - 23 mg/dL Final  . Creatinine, Ser 07/12/2014 0.8  0.4 - 1.2 mg/dL Final  . Total Bilirubin 07/12/2014 0.9  0.2 - 1.2 mg/dL Final  . Alkaline Phosphatase 07/12/2014 48  39 - 117 U/L Final  . AST 07/12/2014 38* 0 - 37 U/L Final  . ALT 07/12/2014 56* 0 - 35 U/L Final   . Total Protein 07/12/2014 7.1  6.0 - 8.3 g/dL Final  . Albumin 07/12/2014 4.1  3.5 - 5.2 g/dL Final  . Calcium 07/12/2014 9.0  8.4 - 10.5 mg/dL Final  . GFR 07/12/2014 103.95  >60.00 mL/min Final  . Cholesterol 07/12/2014 178  0 - 200 mg/dL Final   ATP III Classification       Desirable:  < 200 mg/dL               Borderline High:  200 - 239 mg/dL          High:  > = 240 mg/dL  . Triglycerides 07/12/2014 74.0  0.0 - 149.0 mg/dL Final   Normal:  <150 mg/dLBorderline High:  150 - 199 mg/dL  . HDL 07/12/2014 49.60  >39.00 mg/dL Final  . VLDL 07/12/2014 14.8  0.0 - 40.0 mg/dL Final  . LDL  Cholesterol 07/12/2014 114* 0 - 99 mg/dL Final  . Total CHOL/HDL Ratio 07/12/2014 4   Final                  Men          Women1/2 Average Risk     3.4          3.3Average Risk          5.0          4.42X Average Risk          9.6          7.13X Average Risk          15.0          11.0                      . NonHDL 07/12/2014 128.40   Final   NOTE:  Non-HDL goal should be 30 mg/dL higher than patient's LDL goal (i.e. LDL goal of < 70 mg/dL, would have non-HDL goal of < 100 mg/dL)  . TSH 07/12/2014 1.04  0.35 - 4.50 uIU/mL Final  . Hgb A1c MFr Bld 07/12/2014 8.0* 4.6 - 6.5 % Final   Glycemic Control Guidelines for People with Diabetes:Non Diabetic:  <6%Goal of Therapy: <7%Additional Action Suggested:  >8%     Physical Examination:  BP 132/94 mmHg  Pulse 75  Temp(Src) 98.1 F (36.7 C)  Resp 16  Ht 5\' 5"  (1.651 m)  Wt 227 lb 9.6 oz (103.239 kg)  BMI 37.87 kg/m2  SpO2 97%  LMP 03/15/2013      ASSESSMENT:  Diabetes type 2, uncontrolled    She appears to have significant improvement in her blood sugars with adding Victoza to her previous  regimen of maximum doses Invokana, metformin and Amaryl She still has  Difficulty losing weight and not clear why, she thinks she is having more satiety with Victoza Although she has started checking her blood sugar more often she is not doing many readings after  meals Appears to be getting some hypoglycemia with current regimen of Amaryl   PLAN:  She  will change Amaryl to half tablet twice a day May consider reducing this further Need to check blood sugars at various times including after meals Consistent diet  Regular exercise   Patient Instructions  Glimeperide 1/2 tab twice daily  Please check blood sugars at least half the time about 2 hours after any meal especially luncnh and 3 times per week on waking up. Please bring blood sugar monitor to each visit. Recommended blood sugar levels about 2 hours after meal is 140-180 and on waking up 90-130     Debany Vantol 08/11/2014, 12:04 PM

## 2014-08-11 NOTE — Patient Instructions (Addendum)
Glimeperide 1/2 tab twice daily  Please check blood sugars at least half the time about 2 hours after any meal especially luncnh and 3 times per week on waking up. Please bring blood sugar monitor to each visit. Recommended blood sugar levels about 2 hours after meal is 140-180 and on waking up 90-130

## 2014-08-29 ENCOUNTER — Other Ambulatory Visit: Payer: Self-pay | Admitting: Internal Medicine

## 2014-09-23 ENCOUNTER — Ambulatory Visit: Payer: BLUE CROSS/BLUE SHIELD | Admitting: Endocrinology

## 2014-09-29 ENCOUNTER — Ambulatory Visit (INDEPENDENT_AMBULATORY_CARE_PROVIDER_SITE_OTHER): Payer: BLUE CROSS/BLUE SHIELD | Admitting: Endocrinology

## 2014-09-29 ENCOUNTER — Encounter: Payer: Self-pay | Admitting: Endocrinology

## 2014-09-29 VITALS — BP 128/72 | HR 95 | Temp 97.7°F | Resp 14 | Ht 65.0 in | Wt 225.0 lb

## 2014-09-29 DIAGNOSIS — E1165 Type 2 diabetes mellitus with hyperglycemia: Secondary | ICD-10-CM

## 2014-09-29 DIAGNOSIS — E785 Hyperlipidemia, unspecified: Secondary | ICD-10-CM

## 2014-09-29 DIAGNOSIS — IMO0002 Reserved for concepts with insufficient information to code with codable children: Secondary | ICD-10-CM

## 2014-09-29 LAB — BASIC METABOLIC PANEL
BUN: 14 mg/dL (ref 6–23)
CHLORIDE: 103 meq/L (ref 96–112)
CO2: 27 mEq/L (ref 19–32)
Calcium: 9.4 mg/dL (ref 8.4–10.5)
Creatinine, Ser: 0.74 mg/dL (ref 0.40–1.20)
GFR: 107.11 mL/min (ref >60.00)
GLUCOSE: 134 mg/dL — AB (ref 70–99)
Potassium: 3.9 mEq/L (ref 3.5–5.1)
Sodium: 137 mEq/L (ref 135–145)

## 2014-09-29 LAB — HEMOGLOBIN A1C: Hgb A1c MFr Bld: 6.5 % (ref 4.6–6.5)

## 2014-09-29 LAB — LDL CHOLESTEROL, DIRECT: Direct LDL: 100 mg/dL

## 2014-09-29 MED ORDER — GLIMEPIRIDE 2 MG PO TABS: 2.0000 mg | ORAL_TABLET | Freq: Two times a day (BID) | ORAL | Status: DC

## 2014-09-29 NOTE — Progress Notes (Deleted)
Patient ID: Carla Williams, female   DOB: [birth year], 49 y.o.   MRN: [id number]    Reason for Appointment : for Type 2 Diabetes  History of Present Illness          Diagnosis: Type 2 diabetes mellitus, date of diagnosis: 2007      Past history: She was initially told to have mild diabetes and advised to lose weight with diet and exercise. About a year later because of higher blood sugars she was started on metformin low dose and this was increased. However because of lack of adequate control she was given Amaryl in addition soon after She thinks her blood sugars and overall were subsequently controlled with A1c as low as around 6% but records are not available In 2014 blood sugars apparently started increasing significantly. She was initially given Januvia but subsequently in 11/14 she still had a high A1c She has taken Invokana since 11/14 which was started because of a high A1c of 9.1%  Recent history:   She was started on Victoza in 12/15 because of persistently high A1c of 8%. She has titrated this to 1.2 mg without any side effects and is taking this at night.  Trulicity was not covered by her insurance He does help her control her portions and her blood sugars appear to be improving significantly She is also on Amaryl and metformin. However not clear why she has not lost any weight Home glucose monitoring: She is doing this primarily in the mornings and only occasionally later in the day. Her blood sugars appear to be somewhat erratic at all times and has occasional high readings    Also has had some sporadic hypoglycemia in afternoon       Oral hypoglycemic drugs the patient is taking are: Invokana, Amaryl 1/2 bid , metformin      Side effects from medications have been: None   Glucose monitoring:  done < one time a day         Glucometer: One Touch .      Blood Glucose readings    PRE-MEAL Breakfast Lunch Dinner pcs Overall  Glucose range: 153      Mean:     129    POST-MEAL PC Breakfast PC Lunch PC Dinner  Glucose range:     Mean/median:        PRE-MEAL Breakfast Lunch  8 PM   PCS  Overall  Glucose range:  51-174   70-176   71   83-239  51-239   Mean/median:  108     103    Glycemic control:   Lab Results  Component Value Date   HGBA1C 8.0* 07/12/2014   HGBA1C 8.0* 06/21/2014   HGBA1C 7.9* 09/21/2013   Lab Results  Component Value Date   MICROALBUR 2.3* 05/18/2013   LDLCALC 114* 07/12/2014   CREATININE 0.8 07/12/2014    Self-care: The diet that the patient has been following is: tries to limit carbohydrates more.      Meals: 2-3 meals per day. No breakfast; Lunch may be larger           Exercise: walking or Zumba 2/7         Dietician visit:  05/2014               Retinal exam: Most recent:    Weight history:  Wt Readings from Last 3 Encounters:  09/29/14 225 lb (102.059 kg)  08/11/14 227 lb 9.6 oz (103.239 kg)  07/26/14 226  lb (102.513 kg)      Medication List       This list is accurate as of: 09/29/14  2:16 PM.  Always use your most recent med list.               atorvastatin 40 MG tablet  Commonly known as:  LIPITOR  TAKE 1 TABLET (40 MG TOTAL) BY MOUTH DAILY. ONE TABLET BY MOUTH ONCE DAILY     benazepril-hydrochlorthiazide 10-12.5 MG per tablet  Commonly known as:  LOTENSIN HCT  TAKE 1 TABLET BY MOUTH DAILY.     esomeprazole 40 MG capsule  Commonly known as:  NEXIUM  Take 1 capsule (40 mg total) by mouth daily before breakfast. As needed     glimepiride 4 MG tablet  Commonly known as:  AMARYL  Take 1 tablet (4 mg total) by mouth 2 (two) times daily.     glucose blood test strip  Commonly known as:  ONETOUCH VERIO  Use as instructed to check blood sugar 2 times per day dx code E11.9     ibuprofen 800 MG tablet  Commonly known as:  ADVIL,MOTRIN  Take 1 tablet (800 mg total) by mouth as needed.     Insulin Pen Needle 32G X 5 MM Misc  Commonly known as:  NOVOTWIST  Use one per day to inject  Victoza     INVOKANA 300 MG Tabs tablet  Generic drug:  canagliflozin  TAKE 1 TABLET (300 MG TOTAL) BY MOUTH DAILY.     Liraglutide 18 MG/3ML Sopn  Commonly known as:  [employer] off with 0.6mg  daily, after one week if no nausea or vomiting increase dose to 1.2 mg daily     LORazepam 0.5 MG tablet  Commonly known as:  ATIVAN  Take 1 tablet (0.5 mg total) by mouth 2 (two) times daily as needed for anxiety.     metFORMIN 500 MG 24 hr tablet  Commonly known as:  GLUCOPHAGE-XR  Take 4 tablets (2,000 mg total) by mouth daily with supper.        Allergies: No Known Allergies  Past Medical History  Diagnosis Date  . GERD (gastroesophageal reflux disease)   . Hyperlipemia   . Diabetes mellitus     Past Surgical History  Procedure Laterality Date  . Tubal ligation      Family History  Problem Relation Age of Onset  . Colon cancer Neg Hx   . Breast cancer      Maternal Great Aunt   . Diabetes Father     and Mother  . Heart disease Father     Social History:  reports that she has never smoked. She has never used smokeless tobacco. She reports that she does not drink alcohol or use illicit drugs.    Review of Systems       Lipids: She has been on Lipitor from her previous PCP even though she thinks she has been compliant with the medication and refills her lipids are not well-controlled recently. She has just seen the dietitian couple of weeks ago and thinks she can improve her diet  Lab Results  Component Value Date   CHOL 178 07/12/2014   CHOL 185 06/21/2014   CHOL 143 09/21/2013   Lab Results  Component Value Date   HDL 49.60 07/12/2014   HDL 47.70 06/21/2014   HDL 55.70 09/21/2013   Lab Results  Component Value Date   LDLCALC 114* 07/12/2014   LDLCALC 110* 06/21/2014  LDLCALC 72 09/21/2013   Lab Results  Component Value Date   TRIG 74.0 07/12/2014   TRIG 135.0 06/21/2014   TRIG 75.0 09/21/2013   Lab Results  Component Value Date   CHOLHDL 4  07/12/2014   CHOLHDL 4 06/21/2014   CHOLHDL 3 09/21/2013   No results found for: LDLDIRECT                     The blood pressure has been controlled with Lotensin HCT over the last couple of years      LABS:  No visits with results within 1 Week(s) from this visit. Latest known visit with results is:  Lab on 07/12/2014  Component Date Value Ref Range Status  . WBC 07/12/2014 4.2  4.0 - 10.5 K/uL Final  . RBC 07/12/2014 4.40  3.87 - 5.11 Mil/uL Final  . Hemoglobin 07/12/2014 13.4  12.0 - 15.0 g/dL Final  . HCT 07/12/2014 40.2  36.0 - 46.0 % Final  . MCV 07/12/2014 91.3  78.0 - 100.0 fl Final  . MCHC 07/12/2014 33.2  30.0 - 36.0 g/dL Final  . RDW 07/12/2014 13.2  11.5 - 15.5 % Final  . Platelets 07/12/2014 261.0  150.0 - 400.0 K/uL Final  . Neutrophils Relative % 07/12/2014 39.7* 43.0 - 77.0 % Final  . Lymphocytes Relative 07/12/2014 49.7* 12.0 - 46.0 % Final  . Monocytes Relative 07/12/2014 8.4  3.0 - 12.0 % Final  . Eosinophils Relative 07/12/2014 1.7  0.0 - 5.0 % Final  . Basophils Relative 07/12/2014 0.5  0.0 - 3.0 % Final  . Neutro Abs 07/12/2014 1.7  1.4 - 7.7 K/uL Final  . Lymphs Abs 07/12/2014 2.1  0.7 - 4.0 K/uL Final  . Monocytes Absolute 07/12/2014 0.4  0.1 - 1.0 K/uL Final  . Eosinophils Absolute 07/12/2014 0.1  0.0 - 0.7 K/uL Final  . Basophils Absolute 07/12/2014 0.0  0.0 - 0.1 K/uL Final  . Sodium 07/12/2014 139  135 - 145 mEq/L Final  . Potassium 07/12/2014 3.6  3.5 - 5.1 mEq/L Final  . Chloride 07/12/2014 107  96 - 112 mEq/L Final  . CO2 07/12/2014 25  19 - 32 mEq/L Final  . Glucose, Bld 07/12/2014 126* 70 - 99 mg/dL Final  . BUN 07/12/2014 11  6 - 23 mg/dL Final  . Creatinine, Ser 07/12/2014 0.8  0.4 - 1.2 mg/dL Final  . Total Bilirubin 07/12/2014 0.9  0.2 - 1.2 mg/dL Final  . Alkaline Phosphatase 07/12/2014 48  39 - 117 U/L Final  . AST 07/12/2014 38* 0 - 37 U/L Final  . ALT 07/12/2014 56* 0 - 35 U/L Final  . Total Protein 07/12/2014 7.1  6.0 - 8.3  g/dL Final  . Albumin 07/12/2014 4.1  3.5 - 5.2 g/dL Final  . Calcium 07/12/2014 9.0  8.4 - 10.5 mg/dL Final  . GFR 07/12/2014 103.95  >60.00 mL/min Final  . Cholesterol 07/12/2014 178  0 - 200 mg/dL Final   ATP III Classification       Desirable:  < 200 mg/dL               Borderline High:  200 - 239 mg/dL          High:  > = 240 mg/dL  . Triglycerides 07/12/2014 74.0  0.0 - 149.0 mg/dL Final   Normal:  <150 mg/dLBorderline High:  150 - 199 mg/dL  . HDL 07/12/2014 49.60  >39.00 mg/dL Final  . VLDL 07/12/2014 14.8  0.0 - 40.0 mg/dL Final  . LDL Cholesterol 07/12/2014 114* 0 - 99 mg/dL Final  . Total CHOL/HDL Ratio 07/12/2014 4   Final                  Men          Women1/2 Average Risk     3.4          3.3Average Risk          5.0          4.42X Average Risk          9.6          7.13X Average Risk          15.0          11.0                      . NonHDL 07/12/2014 128.40   Final   NOTE:  Non-HDL goal should be 30 mg/dL higher than patient's LDL goal (i.e. LDL goal of < 70 mg/dL, would have non-HDL goal of < 100 mg/dL)  . TSH 07/12/2014 1.04  0.35 - 4.50 uIU/mL Final  . Hgb A1c MFr Bld 07/12/2014 8.0* 4.6 - 6.5 % Final   Glycemic Control Guidelines for People with Diabetes:Non Diabetic:  <6%Goal of Therapy: <7%Additional Action Suggested:  >8%     Physical Examination:  BP 128/72 mmHg  Pulse 95  Temp(Src) 97.7 F (36.5 C)  Resp 14  Ht 5\' 5"  (1.651 m)  Wt 225 lb (102.059 kg)  BMI 37.44 kg/m2  SpO2 98%  LMP 03/15/2013      ASSESSMENT:  Diabetes type 2, uncontrolled    She appears to have significant improvement in her blood sugars with adding Victoza to her previous  regimen of maximum doses Invokana, metformin and Amaryl She still has  Difficulty losing weight and not clear why, she thinks she is having more satiety with Victoza Although she has started checking her blood sugar more often she is not doing many readings after meals Appears to be getting some hypoglycemia with  current regimen of Amaryl   PLAN:  She  will change Amaryl to half tablet twice a day May consider reducing this further Need to check blood sugars at various times including after meals Consistent diet  Regular exercise   There are no Patient Instructions on file for this visit.  Ernesha Ramone 09/29/2014, 2:16 PM

## 2014-09-29 NOTE — Patient Instructions (Signed)
Glimeperide 1/2 of 2 mg twice daily   Please check blood sugars at least half the time about 2 hours after any meal and 3 times per week on waking up. Please bring blood sugar monitor to each visit. Recommended blood sugar levels about 2 hours after meal is 140-180 and on waking up 90-130

## 2014-10-02 NOTE — Progress Notes (Signed)
Patient ID: Carla Williams, female   DOB: [birth year], 49 y.o.   MRN: [id number]    Reason for Appointment : for Type 2 Diabetes  History of Present Illness          Diagnosis: Type 2 diabetes mellitus, date of diagnosis: 2007      Past history: Carla Williams was initially told to have mild diabetes and advised to lose weight with diet and exercise. About a year later because of higher blood sugars Carla Williams was started on metformin low dose and this was increased. However because of lack of adequate control Carla Williams was given Amaryl in addition soon after Carla Williams thinks her blood sugars and overall were subsequently controlled with A1c as low as around 6% but records are not available In 2014 blood sugars apparently started increasing significantly. Carla Williams was initially given Januvia but subsequently in 11/14 Carla Williams still had a high A1c Carla Williams has taken Invokana since 11/14 which was started because of a high A1c of 9.1%  Recent history:   Carla Williams was started on Victoza in 12/15 because of persistently high A1c of 8%. Carla Williams has significant improvement in her A1c now compared to previous level of 8% No side effects from Victoza Because of her tendency to lower readings in the afternoon her Amaryl was changed to half tablet twice a day Blood sugars are excellent now and usually not high However has checked only 1 reading in the mornings which was not a true fasting       Oral hypoglycemic drugs the patient is taking are: Invokana, Amaryl 1/2 bid , metformin      Side effects from medications have been: None   Glucose monitoring:  done < one time a day         Glucometer: One Touch .      Blood Glucose readings    PRE-MEAL Breakfast  1-2 PM  Dinner pcs Overall  Glucose range: 153  102-188    90-202    Mean:     126  129   Glycemic control:   Lab Results  Component Value Date   HGBA1C 6.5 09/29/2014   HGBA1C 8.0* 07/12/2014   HGBA1C 8.0* 06/21/2014   Lab Results  Component Value Date   MICROALBUR 2.3* 05/18/2013   LDLCALC 114* 07/12/2014   CREATININE 0.74 09/29/2014    Self-care: The diet that the patient has been following is: tries to limit carbohydrates more.      Meals: 2-3 meals per day. No breakfast; Lunch may be larger           Exercise: walking or Zumba 2/7         Dietician visit:  05/2014               Retinal exam: Most recent:    Weight history:  Wt Readings from Last 3 Encounters:  09/29/14 225 lb (102.059 kg)  08/11/14 227 lb 9.6 oz (103.239 kg)  07/26/14 226 lb (102.513 kg)      Medication List       This list is accurate as of: 09/29/14 11:59 PM.  Always use your most recent med list.               atorvastatin 40 MG tablet  Commonly known as:  LIPITOR  TAKE 1 TABLET (40 MG TOTAL) BY MOUTH DAILY. ONE TABLET BY MOUTH ONCE DAILY     benazepril-hydrochlorthiazide 10-12.5 MG per tablet  Commonly known as:  LOTENSIN HCT  TAKE 1 TABLET BY  MOUTH DAILY.     esomeprazole 40 MG capsule  Commonly known as:  NEXIUM  Take 1 capsule (40 mg total) by mouth daily before breakfast. As needed     glimepiride 2 MG tablet  Commonly known as:  AMARYL  Take 1 tablet (2 mg total) by mouth 2 (two) times daily.     glucose blood test strip  Commonly known as:  ONETOUCH VERIO  Use as instructed to check blood sugar 2 times per day dx code E11.9     ibuprofen 800 MG tablet  Commonly known as:  ADVIL,MOTRIN  Take 1 tablet (800 mg total) by mouth as needed.     Insulin Pen Needle 32G X 5 MM Misc  Commonly known as:  NOVOTWIST  Use one per day to inject Victoza     INVOKANA 300 MG Tabs tablet  Generic drug:  canagliflozin  TAKE 1 TABLET (300 MG TOTAL) BY MOUTH DAILY.     Liraglutide 18 MG/3ML Sopn  Commonly known as:  [employer] off with 0.6mg  daily, after one week if no nausea or vomiting increase dose to 1.2 mg daily     LORazepam 0.5 MG tablet  Commonly known as:  ATIVAN  Take 1 tablet (0.5 mg total) by mouth 2 (two) times daily as needed for anxiety.     metFORMIN  500 MG 24 hr tablet  Commonly known as:  GLUCOPHAGE-XR  Take 4 tablets (2,000 mg total) by mouth daily with supper.        Allergies: No Known Allergies  Past Medical History  Diagnosis Date  . GERD (gastroesophageal reflux disease)   . Hyperlipemia   . Diabetes mellitus     Past Surgical History  Procedure Laterality Date  . Tubal ligation      Family History  Problem Relation Age of Onset  . Colon cancer Neg Hx   . Breast cancer      Maternal Great Aunt   . Diabetes Father     and Mother  . Heart disease Father     Social History:  reports that Carla Williams has never smoked. Carla Williams has never used smokeless tobacco. Carla Williams reports that Carla Williams does not drink alcohol or use illicit drugs.    Review of Systems       Lipids: Carla Williams has been on Lipitor from her previous PCP  His direct LDL is now 100 Carla Williams has just seen the dietitian and has made some improvements in diet   Lab Results  Component Value Date   CHOL 178 07/12/2014   CHOL 185 06/21/2014   CHOL 143 09/21/2013   Lab Results  Component Value Date   HDL 49.60 07/12/2014   HDL 47.70 06/21/2014   HDL 55.70 09/21/2013   Lab Results  Component Value Date   LDLCALC 114* 07/12/2014   LDLCALC 110* 06/21/2014   LDLCALC 72 09/21/2013   Lab Results  Component Value Date   TRIG 74.0 07/12/2014   TRIG 135.0 06/21/2014   TRIG 75.0 09/21/2013   Lab Results  Component Value Date   CHOLHDL 4 07/12/2014   CHOLHDL 4 06/21/2014   CHOLHDL 3 09/21/2013   Lab Results  Component Value Date   LDLDIRECT 100.0 09/29/2014                       The blood pressure has been controlled with Lotensin HCT over the last couple of years      LABS:  Office Visit on  09/29/2014  Component Date Value Ref Range Status  . Hgb A1c MFr Bld 09/29/2014 6.5  4.6 - 6.5 % Final   Glycemic Control Guidelines for People with Diabetes:Non Diabetic:  <6%Goal of Therapy: <7%Additional Action Suggested:  >8%   . Sodium 09/29/2014 137  135 - 145  mEq/L Final  . Potassium 09/29/2014 3.9  3.5 - 5.1 mEq/L Final  . Chloride 09/29/2014 103  96 - 112 mEq/L Final  . CO2 09/29/2014 27  19 - 32 mEq/L Final  . Glucose, Bld 09/29/2014 134* 70 - 99 mg/dL Final  . BUN 09/29/2014 14  6 - 23 mg/dL Final  . Creatinine, Ser 09/29/2014 0.74  0.40 - 1.20 mg/dL Final  . Calcium 09/29/2014 9.4  8.4 - 10.5 mg/dL Final  . GFR 09/29/2014 107.11  >60.00 mL/min Final  . Direct LDL 09/29/2014 100.0   Final   Optimal:  <100 mg/dLNear or Above Optimal:  100-129 mg/dLBorderline High:  130-159 mg/dLHigh:  160-189 mg/dLVery High:  >190 mg/dL    Physical Examination:  BP 128/72 mmHg  Pulse 95  Temp(Src) 97.7 F (36.5 C)  Resp 14  Ht 5\' 5"  (1.651 m)  Wt 225 lb (102.059 kg)  BMI 37.44 kg/m2  SpO2 98%  LMP 03/15/2013      ASSESSMENT:  Diabetes type 2, uncontrolled    Carla Williams appears to have significant improvement in her blood sugars with continuing Victoza to her previous  regimen of maximum doses Invokana, metformin and low-dose Amaryl Carla Williams still has not lost much weight even though her portions are fairly well controlled Carla Williams can do better with her exercise regimen  HYPERLIPIDEMIA: Well controlled  PLAN:   Will continue the same regimen for now Carla Williams will need to do more readings fasting to help adjust her Amaryl dose Increase frequency of exercise    Carla Williams 10/02/2014, 6:12 PM

## 2014-10-11 ENCOUNTER — Other Ambulatory Visit: Payer: Self-pay | Admitting: Internal Medicine

## 2014-12-06 ENCOUNTER — Other Ambulatory Visit: Payer: Self-pay | Admitting: Endocrinology

## 2014-12-10 ENCOUNTER — Other Ambulatory Visit: Payer: Self-pay | Admitting: Endocrinology

## 2014-12-26 ENCOUNTER — Other Ambulatory Visit: Payer: BLUE CROSS/BLUE SHIELD

## 2014-12-29 ENCOUNTER — Ambulatory Visit: Payer: BLUE CROSS/BLUE SHIELD | Admitting: Endocrinology

## 2015-01-09 ENCOUNTER — Other Ambulatory Visit: Payer: Self-pay | Admitting: Endocrinology

## 2015-01-19 ENCOUNTER — Other Ambulatory Visit (INDEPENDENT_AMBULATORY_CARE_PROVIDER_SITE_OTHER): Payer: BLUE CROSS/BLUE SHIELD

## 2015-01-19 DIAGNOSIS — E1165 Type 2 diabetes mellitus with hyperglycemia: Secondary | ICD-10-CM

## 2015-01-19 DIAGNOSIS — IMO0002 Reserved for concepts with insufficient information to code with codable children: Secondary | ICD-10-CM

## 2015-01-19 LAB — COMPREHENSIVE METABOLIC PANEL
ALT: 56 U/L — AB (ref 0–35)
AST: 40 U/L — AB (ref 0–37)
Albumin: 4.3 g/dL (ref 3.5–5.2)
Alkaline Phosphatase: 54 U/L (ref 39–117)
BILIRUBIN TOTAL: 0.9 mg/dL (ref 0.2–1.2)
BUN: 15 mg/dL (ref 6–23)
CHLORIDE: 102 meq/L (ref 96–112)
CO2: 30 meq/L (ref 19–32)
CREATININE: 0.73 mg/dL (ref 0.40–1.20)
Calcium: 9.3 mg/dL (ref 8.4–10.5)
GFR: 108.66 mL/min (ref >60.00)
Glucose, Bld: 197 mg/dL — ABNORMAL HIGH (ref 70–99)
Potassium: 4 mEq/L (ref 3.5–5.1)
Sodium: 139 mEq/L (ref 135–145)
TOTAL PROTEIN: 6.9 g/dL (ref 6.0–8.3)

## 2015-01-19 LAB — MICROALBUMIN / CREATININE URINE RATIO
CREATININE, U: 105.8 mg/dL
MICROALB/CREAT RATIO: 0.7 mg/g (ref 0.0–30.0)

## 2015-01-19 LAB — HEMOGLOBIN A1C: Hgb A1c MFr Bld: 7 % — ABNORMAL HIGH (ref 4.6–6.5)

## 2015-01-24 ENCOUNTER — Encounter: Payer: Self-pay | Admitting: *Deleted

## 2015-01-24 ENCOUNTER — Ambulatory Visit (INDEPENDENT_AMBULATORY_CARE_PROVIDER_SITE_OTHER): Payer: BLUE CROSS/BLUE SHIELD | Admitting: Endocrinology

## 2015-01-24 ENCOUNTER — Encounter: Payer: Self-pay | Admitting: Endocrinology

## 2015-01-24 VITALS — BP 122/82 | HR 82 | Temp 97.8°F | Resp 16 | Ht 65.0 in | Wt 222.2 lb

## 2015-01-24 DIAGNOSIS — E785 Hyperlipidemia, unspecified: Secondary | ICD-10-CM | POA: Diagnosis not present

## 2015-01-24 DIAGNOSIS — E1165 Type 2 diabetes mellitus with hyperglycemia: Secondary | ICD-10-CM

## 2015-01-24 DIAGNOSIS — K7689 Other specified diseases of liver: Secondary | ICD-10-CM | POA: Diagnosis not present

## 2015-01-24 DIAGNOSIS — I10 Essential (primary) hypertension: Secondary | ICD-10-CM | POA: Diagnosis not present

## 2015-01-24 DIAGNOSIS — IMO0002 Reserved for concepts with insufficient information to code with codable children: Secondary | ICD-10-CM

## 2015-01-24 DIAGNOSIS — R945 Abnormal results of liver function studies: Secondary | ICD-10-CM

## 2015-01-24 NOTE — Progress Notes (Signed)
Patient ID: Carla Williams, female   DOB: [birth year], 49 y.o.   MRN: [id number]    Reason for Appointment : for Type 2 Diabetes  History of Present Illness          Diagnosis: Type 2 diabetes mellitus, date of diagnosis: 2007      Past history: She was initially told to have mild diabetes and advised to lose weight with diet and exercise. About a year later because of higher blood sugars she was started on metformin low dose and this was increased. However because of lack of adequate control she was given Amaryl in addition soon after She thinks her blood sugars and overall were subsequently controlled with A1c as low as around 6% but records are not available In 2014 blood sugars apparently started increasing significantly. She was initially given Januvia but subsequently in 11/14 she still had a high A1c She has taken Invokana since 11/14 which was started because of a high A1c of 9.1%  Recent history:   She was started on Victoza in 12/15 because of persistently high A1c of 8%. She has significant improvement in her A1c with this compared to previous level of 8% No side effects from Victoza but she thinks that sometimes she feels sluggish and may have mild nausea and now is trying to take this every other day when she does not feel good She was supposed to take Amaryl half tablet twice a day but she is only taking one tablet in the morning Does not think she feels hypoglycemic Again she has not checked her blood sugars as directed and no readings available on her monitor Tolerating Invokana without side effects  Recently has not been doing much exercise but has lost a little weight and she thinks she generally watching her diet and tries to limit carbohydrates       Oral hypoglycemic drugs the patient is taking are: Invokana 300 mg, Amaryl qd , metformin      Side effects from medications have been: None   Glucose monitoring:  done < one time a day         Glucometer: One Touch .       Blood Glucose readings  none recently  Glycemic control:   Lab Results  Component Value Date   HGBA1C 7.0* 01/19/2015   HGBA1C 6.5 09/29/2014   HGBA1C 8.0* 07/12/2014   Lab Results  Component Value Date   MICROALBUR <0.7 01/19/2015   LDLCALC 114* 07/12/2014   CREATININE 0.73 01/19/2015    Self-care: The diet that the patient has been following is: tries to limit carbohydrate and fruits .      Meals: 2-3 meals per day. No breakfast; Lunch may be larger.           Exercise: walking occasionally, previously was doing aerobic dancing         Dietician visit:  05/2014              Weight history:  Wt Readings from Last 3 Encounters:  01/24/15 222 lb 3.2 oz (100.789 kg)  09/29/14 225 lb (102.059 kg)  08/11/14 227 lb 9.6 oz (103.239 kg)      Medication List       This list is accurate as of: 01/24/15 11:58 AM.  Always use your most recent med list.               atorvastatin 40 MG tablet  Commonly known as:  LIPITOR  TAKE 1 TABLET (40  MG TOTAL) BY MOUTH DAILY. ONE TABLET BY MOUTH ONCE DAILY     benazepril-hydrochlorthiazide 10-12.5 MG per tablet  Commonly known as:  LOTENSIN HCT  TAKE 1 TABLET BY MOUTH DAILY.     esomeprazole 40 MG capsule  Commonly known as:  NEXIUM  Take 1 capsule (40 mg total) by mouth daily before breakfast. As needed     glimepiride 4 MG tablet  Commonly known as:  AMARYL  Take 4 mg by mouth daily with breakfast.     glucose blood test strip  Commonly known as:  ONETOUCH VERIO  Use as instructed to check blood sugar 2 times per day dx code E11.9     ibuprofen 800 MG tablet  Commonly known as:  ADVIL,MOTRIN  Take 1 tablet (800 mg total) by mouth as needed.     Insulin Pen Needle 32G X 5 MM Misc  Commonly known as:  NOVOTWIST  Use one per day to inject Victoza     INVOKANA 300 MG Tabs tablet  Generic drug:  canagliflozin  TAKE 1 TABLET BY MOUTH DAILY     Liraglutide 18 MG/3ML Sopn  Commonly known as:  VICTOZA  Inject 1.2 mg  daily     LORazepam 0.5 MG tablet  Commonly known as:  ATIVAN  Take 1 tablet (0.5 mg total) by mouth 2 (two) times daily as needed for anxiety.     metFORMIN 500 MG 24 hr tablet  Commonly known as:  GLUCOPHAGE-XR  TAKE 4 TABLETS (2,000 MG TOTAL) BY MOUTH DAILY WITH SUPPER.        Allergies: No Known Allergies  Past Medical History  Diagnosis Date  . GERD (gastroesophageal reflux disease)   . Hyperlipemia   . Diabetes mellitus     Past Surgical History  Procedure Laterality Date  . Tubal ligation      Family History  Problem Relation Age of Onset  . Colon cancer Neg Hx   . Breast cancer      Maternal Great Aunt   . Diabetes Father     and Mother  . Heart disease Father     Social History:  reports that she has never smoked. She has never used smokeless tobacco. She reports that she does not drink alcohol or use illicit drugs.    Review of Systems       Lipids: She has been on Lipitor from her PCP  Last direct LDL is  100 She has just seen the dietitian in 12/15 and has made some improvements in diet   Lab Results  Component Value Date   CHOL 178 07/12/2014   CHOL 185 06/21/2014   CHOL 143 09/21/2013   Lab Results  Component Value Date   HDL 49.60 07/12/2014   HDL 47.70 06/21/2014   HDL 55.70 09/21/2013   Lab Results  Component Value Date   LDLCALC 114* 07/12/2014   LDLCALC 110* 06/21/2014   LDLCALC 72 09/21/2013   Lab Results  Component Value Date   TRIG 74.0 07/12/2014   TRIG 135.0 06/21/2014   TRIG 75.0 09/21/2013   Lab Results  Component Value Date   CHOLHDL 4 07/12/2014   CHOLHDL 4 06/21/2014   CHOLHDL 3 09/21/2013   Lab Results  Component Value Date   LDLDIRECT 100.0 09/29/2014                       The blood pressure has been controlled with Lotensin HCT over the last couple of  years    LIVER functions: These have been mildly abnormal persistently, have not been addressed by PCP   LABS:  Appointment on 01/19/2015   Component Date Value Ref Range Status  . Hgb A1c MFr Bld 01/19/2015 7.0* 4.6 - 6.5 % Final   Glycemic Control Guidelines for People with Diabetes:Non Diabetic:  <6%Goal of Therapy: <7%Additional Action Suggested:  >8%   . Sodium 01/19/2015 139  135 - 145 mEq/L Final  . Potassium 01/19/2015 4.0  3.5 - 5.1 mEq/L Final  . Chloride 01/19/2015 102  96 - 112 mEq/L Final  . CO2 01/19/2015 30  19 - 32 mEq/L Final  . Glucose, Bld 01/19/2015 197* 70 - 99 mg/dL Final  . BUN 01/19/2015 15  6 - 23 mg/dL Final  . Creatinine, Ser 01/19/2015 0.73  0.40 - 1.20 mg/dL Final  . Total Bilirubin 01/19/2015 0.9  0.2 - 1.2 mg/dL Final  . Alkaline Phosphatase 01/19/2015 54  39 - 117 U/L Final  . AST 01/19/2015 40* 0 - 37 U/L Final  . ALT 01/19/2015 56* 0 - 35 U/L Final  . Total Protein 01/19/2015 6.9  6.0 - 8.3 g/dL Final  . Albumin 01/19/2015 4.3  3.5 - 5.2 g/dL Final  . Calcium 01/19/2015 9.3  8.4 - 10.5 mg/dL Final  . GFR 01/19/2015 108.66  >60.00 mL/min Final  . Microalb, Ur 01/19/2015 <0.7  0.0 - 1.9 mg/dL Final  . Creatinine,U 01/19/2015 105.8   Final  . Microalb Creat Ratio 01/19/2015 0.7  0.0 - 30.0 mg/g Final    Physical Examination:  BP 122/82 mmHg  Pulse 82  Temp(Src) 97.8 F (36.6 C)  Resp 16  Ht 5\' 5"  (1.651 m)  Wt 222 lb 3.2 oz (100.789 kg)  BMI 36.98 kg/m2  SpO2 99%  LMP 03/15/2013  No ankle edema present Blood pressure was much lower with the large cuff    ASSESSMENT:  Diabetes type 2, uncontrolled    She appears to have fair control of her diabetes with continuing Victoza, maximum doses Invokana, metformin and 4 mg Amaryl She is concerned about her level of control but as discussed above she has not been motivated to check her blood sugars at home Not clear why her fasting blood sugar in the lab was significantly high at 197 However she previously was taking Amaryl twice a day in divided doses and now is taking this only in the morning Also she thinks she feels sluggish at  times with some of her medications and unclear whether this is from [place]  She can do better with her exercise regimen which she has been very irregular with Discussed A1c targets and that sugar targets at various times  HYPERTENSION: Blood pressure is fairly good today, she does appear to have high readings with small cuff  HYPERLIPIDEMIA: Well controlled  Abnormal liver function: May be fatty liver  PLAN:   Since she has mild nausea from Victoza and possibly malaise she can try using 0.9 mg and this was demonstrated to her She will split the Amaryl to half tablet twice a day Restart glucose monitoring as directed Restart exercise regularly  Follow-up with PCP for abnormal liver functions She will need to have lipids checked again on the next visit  Counseling time on subjects discussed above is over 50% of today's 25 minute visit  Christinea Brizuela 01/24/2015, 11:58 AM

## 2015-01-24 NOTE — Patient Instructions (Addendum)
Glimeperide 1/2 twice daily  Check blood sugars on waking up ..3  .. times a week Also check blood sugars about 2 hours after a meal and do this after different meals by rotation  Recommended blood sugar levels on waking up is 90-130 and about 2 hours after meal is 140-180 Please bring blood sugar monitor to each visit.  Restart exercise  Victoza 0.6 plus 5 clicks daily  Lab Results  Component Value Date   HGBA1C 7.0* 01/19/2015   HGBA1C 6.5 09/29/2014   HGBA1C 8.0* 07/12/2014   Lab Results  Component Value Date   MICROALBUR <0.7 01/19/2015   LDLCALC 114* 07/12/2014   CREATININE 0.73 01/19/2015

## 2015-02-16 ENCOUNTER — Other Ambulatory Visit: Payer: Self-pay | Admitting: Endocrinology

## 2015-02-21 ENCOUNTER — Ambulatory Visit (INDEPENDENT_AMBULATORY_CARE_PROVIDER_SITE_OTHER): Payer: BLUE CROSS/BLUE SHIELD | Admitting: Internal Medicine

## 2015-02-21 ENCOUNTER — Encounter: Payer: Self-pay | Admitting: Internal Medicine

## 2015-02-21 VITALS — BP 130/82 | HR 72 | Temp 98.4°F | Resp 20 | Ht 65.0 in | Wt 217.0 lb

## 2015-02-21 DIAGNOSIS — I10 Essential (primary) hypertension: Secondary | ICD-10-CM

## 2015-02-21 DIAGNOSIS — E119 Type 2 diabetes mellitus without complications: Secondary | ICD-10-CM

## 2015-02-21 DIAGNOSIS — L309 Dermatitis, unspecified: Secondary | ICD-10-CM | POA: Diagnosis not present

## 2015-02-21 NOTE — Progress Notes (Signed)
Subjective:    Patient ID: Carla Williams, female    DOB: [birth year], 49 y.o.   MRN: [id number]  HPI  49 year old patient who is seen today with a chief complaint of a facial rash.  This has improved over the past 2 days.  She did have a photo on her iPhone for review from 2 days prior.  The rash which has been very pruritic is much less erythematous and is fading.  The rash is present in the right facial area involving the right upper eyelid and lower region of the face  She has hypertension, dyslipidemia and type 2 diabetes.  Past Medical History  Diagnosis Date  . GERD (gastroesophageal reflux disease)   . Hyperlipemia   . Diabetes mellitus     Social History   Social History  . Marital Status: Married    Spouse Name: N/A  . Number of Children: 2  . Years of Education: N/A   Occupational History  . Social Worker    Social History Main Topics  . Smoking status: Never Smoker   . Smokeless tobacco: Never Used  . Alcohol Use: No  . Drug Use: No  . Sexual Activity: Not on file   Other Topics Concern  . Not on file   Social History Narrative   One caffeine drink daily     Past Surgical History  Procedure Laterality Date  . Tubal ligation      Family History  Problem Relation Age of Onset  . Colon cancer Neg Hx   . Breast cancer      Maternal Great Aunt   . Diabetes Father     and Mother  . Heart disease Father     No Known Allergies  Current Outpatient Prescriptions on File Prior to Visit  Medication Sig Dispense Refill  . atorvastatin (LIPITOR) 40 MG tablet TAKE 1 TABLET (40 MG TOTAL) BY MOUTH DAILY. ONE TABLET BY MOUTH ONCE DAILY 90 tablet 1  . benazepril-hydrochlorthiazide (LOTENSIN HCT) 10-12.5 MG per tablet TAKE 1 TABLET BY MOUTH DAILY. 90 tablet 2  . esomeprazole (NEXIUM) 40 MG capsule Take 1 capsule (40 mg total) by mouth daily before breakfast. As needed 90 capsule 3  . glimepiride (AMARYL) 4 MG tablet Take 4 mg by mouth daily with breakfast.      . glucose blood (ONETOUCH VERIO) test strip Use as instructed to check blood sugar 2 times per day dx code E11.9 100 each 3  . ibuprofen (ADVIL,MOTRIN) 800 MG tablet Take 1 tablet (800 mg total) by mouth as needed. 90 tablet 3  . Insulin Pen Needle (NOVOTWIST) 32G X 5 MM MISC Use one per day to inject Victoza 50 each 3  . INVOKANA 300 MG TABS tablet TAKE 1 TABLET BY MOUTH DAILY 90 tablet 0  . Liraglutide (VICTOZA) 18 MG/3ML SOPN Inject 1.2 mg daily 2 pen 3  . LORazepam (ATIVAN) 0.5 MG tablet Take 1 tablet (0.5 mg total) by mouth 2 (two) times daily as needed for anxiety. 60 tablet 3  . metFORMIN (GLUCOPHAGE-XR) 500 MG 24 hr tablet TAKE 4 TABLETS BY MOUTH DAILY WITH SUPPER 120 tablet 3   No current facility-administered medications on file prior to visit.    BP 130/82 mmHg  Pulse 72  Temp(Src) 98.4 F (36.9 C) (Oral)  Resp 20  Ht 5\' 5"  (1.651 m)  Wt 217 lb (98.431 kg)  BMI 36.11 kg/m2  SpO2 98%  LMP 03/15/2013     Review of Systems  Constitutional: Negative.   HENT: Negative for congestion, dental problem, hearing loss, rhinorrhea, sinus pressure, sore throat and tinnitus.   Eyes: Negative for pain, discharge and visual disturbance.  Respiratory: Negative for cough and shortness of breath.   Cardiovascular: Negative for chest pain, palpitations and leg swelling.  Gastrointestinal: Negative for nausea, vomiting, abdominal pain, diarrhea, constipation, blood in stool and abdominal distention.  Genitourinary: Negative for dysuria, urgency, frequency, hematuria, flank pain, vaginal bleeding, vaginal discharge, difficulty urinating, vaginal pain and pelvic pain.  Musculoskeletal: Negative for joint swelling, arthralgias and gait problem.  Skin: Positive for rash.  Neurological: Negative for dizziness, syncope, speech difficulty, weakness, numbness and headaches.  Hematological: Negative for adenopathy.  Psychiatric/Behavioral: Negative for behavioral problems, dysphoric mood and  agitation. The patient is not nervous/anxious.        Objective:   Physical Exam  Constitutional: She appears well-developed and well-nourished. No distress.  Skin:  Fading hyperpigmented rash involving the right facial area and the right upper lid          Assessment & Plan:   Fading allergic dermatitis possibly related to nail polish.  Many other possibilities.  Will observe at this time.  Patient will tend to correlate symptoms with possible aggravating factors.  Consider dermatology referral list symptoms persist Hypertension Diabetes.  Follow-up endocrinology

## 2015-02-21 NOTE — Patient Instructions (Signed)
Call or return to clinic prn if these symptoms worsen or fail to improve as anticipated.

## 2015-02-21 NOTE — Progress Notes (Signed)
Pre visit review using our clinic review tool, if applicable. No additional management support is needed unless otherwise documented below in the visit note. 

## 2015-02-27 ENCOUNTER — Other Ambulatory Visit: Payer: Self-pay | Admitting: Internal Medicine

## 2015-02-27 DIAGNOSIS — Z1231 Encounter for screening mammogram for malignant neoplasm of breast: Secondary | ICD-10-CM

## 2015-04-03 ENCOUNTER — Ambulatory Visit
Admission: RE | Admit: 2015-04-03 | Discharge: 2015-04-03 | Disposition: A | Discharge status: Home / Self Care | Payer: BLUE CROSS/BLUE SHIELD | Source: Ambulatory Visit | Attending: Internal Medicine | Admitting: Internal Medicine

## 2015-04-03 DIAGNOSIS — Z1231 Encounter for screening mammogram for malignant neoplasm of breast: Secondary | ICD-10-CM

## 2015-04-24 ENCOUNTER — Other Ambulatory Visit: Payer: BLUE CROSS/BLUE SHIELD

## 2015-04-27 ENCOUNTER — Ambulatory Visit: Payer: BLUE CROSS/BLUE SHIELD | Admitting: Endocrinology

## 2015-05-16 ENCOUNTER — Other Ambulatory Visit: Payer: Self-pay | Admitting: Internal Medicine

## 2015-05-16 ENCOUNTER — Other Ambulatory Visit: Payer: Self-pay | Admitting: Endocrinology

## 2015-06-05 ENCOUNTER — Encounter: Payer: Self-pay | Admitting: Internal Medicine

## 2015-07-17 ENCOUNTER — Other Ambulatory Visit (INDEPENDENT_AMBULATORY_CARE_PROVIDER_SITE_OTHER): Payer: BLUE CROSS/BLUE SHIELD

## 2015-07-17 DIAGNOSIS — Z0000 Encounter for general adult medical examination without abnormal findings: Secondary | ICD-10-CM

## 2015-07-17 LAB — POCT URINALYSIS DIPSTICK
BILIRUBIN UA: NEGATIVE
KETONES UA: NEGATIVE
Leukocytes, UA: NEGATIVE
Nitrite, UA: NEGATIVE
Protein, UA: NEGATIVE
RBC UA: NEGATIVE
SPEC GRAV UA: 1.015
UROBILINOGEN UA: 0.2
pH, UA: 5

## 2015-07-17 LAB — CBC WITH DIFFERENTIAL/PLATELET
BASOS ABS: 0 10*3/uL (ref 0.0–0.1)
BASOS PCT: 0.4 % (ref 0.0–3.0)
EOS PCT: 1.2 % (ref 0.0–5.0)
Eosinophils Absolute: 0.1 10*3/uL (ref 0.0–0.7)
HEMATOCRIT: 41.6 % (ref 36.0–46.0)
HEMOGLOBIN: 13.8 g/dL (ref 12.0–15.0)
LYMPHS PCT: 49.6 % — AB (ref 12.0–46.0)
Lymphs Abs: 2.3 10*3/uL (ref 0.7–4.0)
MCHC: 33.1 g/dL (ref 30.0–36.0)
MCV: 90.4 fl (ref 78.0–100.0)
Monocytes Absolute: 0.3 10*3/uL (ref 0.1–1.0)
Monocytes Relative: 6.9 % (ref 3.0–12.0)
NEUTROS ABS: 2 10*3/uL (ref 1.4–7.7)
Neutrophils Relative %: 41.9 % — ABNORMAL LOW (ref 43.0–77.0)
PLATELETS: 230 10*3/uL (ref 150.0–400.0)
RBC: 4.61 Mil/uL (ref 3.87–5.11)
RDW: 13 % (ref 11.5–15.5)
WBC: 4.7 10*3/uL (ref 4.0–10.5)

## 2015-07-17 LAB — LIPID PANEL
CHOL/HDL RATIO: 4
Cholesterol: 209 mg/dL — ABNORMAL HIGH (ref 0–200)
HDL: 54.8 mg/dL (ref >39.00)
LDL CALC: 131 mg/dL — AB (ref 0–99)
NONHDL: 153.78
Triglycerides: 116 mg/dL (ref 0.0–149.0)
VLDL: 23.2 mg/dL (ref 0.0–40.0)

## 2015-07-17 LAB — BASIC METABOLIC PANEL
BUN: 15 mg/dL (ref 6–23)
CHLORIDE: 101 meq/L (ref 96–112)
CO2: 24 meq/L (ref 19–32)
Calcium: 9.4 mg/dL (ref 8.4–10.5)
Creatinine, Ser: 0.74 mg/dL (ref 0.40–1.20)
GFR: 106.76 mL/min (ref >60.00)
Glucose, Bld: 152 mg/dL — ABNORMAL HIGH (ref 70–99)
POTASSIUM: 3.7 meq/L (ref 3.5–5.1)
Sodium: 140 mEq/L (ref 135–145)

## 2015-07-17 LAB — HEPATIC FUNCTION PANEL
ALBUMIN: 4.4 g/dL (ref 3.5–5.2)
ALK PHOS: 64 U/L (ref 39–117)
ALT: 33 U/L (ref 0–35)
AST: 22 U/L (ref 0–37)
Bilirubin, Direct: 0.2 mg/dL (ref 0.0–0.3)
TOTAL PROTEIN: 7.1 g/dL (ref 6.0–8.3)
Total Bilirubin: 1 mg/dL (ref 0.2–1.2)

## 2015-07-17 LAB — MICROALBUMIN / CREATININE URINE RATIO
Creatinine,U: 131.9 mg/dL
Microalb Creat Ratio: 0.5 mg/g (ref 0.0–30.0)

## 2015-07-17 LAB — HEMOGLOBIN A1C: HEMOGLOBIN A1C: 8.8 % — AB (ref 4.6–6.5)

## 2015-07-17 LAB — TSH: TSH: 1.09 u[IU]/mL (ref 0.35–4.50)

## 2015-07-19 ENCOUNTER — Ambulatory Visit (AMBULATORY_SURGERY_CENTER): Payer: Self-pay | Admitting: *Deleted

## 2015-07-19 VITALS — Ht 65.0 in | Wt 214.0 lb

## 2015-07-19 DIAGNOSIS — Z1211 Encounter for screening for malignant neoplasm of colon: Secondary | ICD-10-CM

## 2015-07-19 MED ORDER — NA SULFATE-K SULFATE-MG SULF 17.5-3.13-1.6 GM/177ML PO SOLN: ORAL | Status: DC

## 2015-07-19 NOTE — Progress Notes (Signed)
Patient denies any allergies to eggs or soy. Patient denies any problems with anesthesia/sedation. Patient denies any oxygen use at home and does not take any diet/weight loss medications. EMMI education assisgned to patient on colonoscopy, this was explained and instructions given to patient. 

## 2015-07-27 ENCOUNTER — Ambulatory Visit (INDEPENDENT_AMBULATORY_CARE_PROVIDER_SITE_OTHER): Payer: BLUE CROSS/BLUE SHIELD | Admitting: Internal Medicine

## 2015-07-27 ENCOUNTER — Encounter: Payer: Self-pay | Admitting: Internal Medicine

## 2015-07-27 VITALS — BP 138/94 | HR 76 | Temp 97.9°F | Resp 20 | Ht 65.0 in | Wt 220.0 lb

## 2015-07-27 DIAGNOSIS — Z0000 Encounter for general adult medical examination without abnormal findings: Secondary | ICD-10-CM

## 2015-07-27 DIAGNOSIS — E119 Type 2 diabetes mellitus without complications: Secondary | ICD-10-CM

## 2015-07-27 DIAGNOSIS — E785 Hyperlipidemia, unspecified: Secondary | ICD-10-CM

## 2015-07-27 DIAGNOSIS — I10 Essential (primary) hypertension: Secondary | ICD-10-CM

## 2015-07-27 NOTE — Progress Notes (Signed)
Pre visit review using our clinic review tool, if applicable. No additional management support is needed unless otherwise documented below in the visit note. 

## 2015-07-27 NOTE — Patient Instructions (Signed)
Please schedule follow-up visit with Dr. Dwyane Dee  Colonoscopy to help detect colon cancer.   Please check your hemoglobin A1c every 3 months  Limit your sodium (Salt) intake  You need to lose weight.  Consider a lower calorie diet and regular exercise.    It is important that you exercise regularly, at least 20 minutes 3 to 4 times per week.  If you develop chest pain or shortness of breath seek  medical attention.  Return in 6 months for follow-up

## 2015-07-27 NOTE — Progress Notes (Signed)
Subjective:    Patient ID: Carla Williams, female    DOB: [birth year], 50 y.o.   MRN: [id number]  HPI  50 year old patient who is seen today for a wellness exam.  She is followed by endocrinology for type 2 diabetes.  She often takes a submaximal dose of metformin and occasionally omits Victoza due to hypoglycemia concerns.  She often gets blood sugars in the eighties, but at times feels poorly.  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 Status post CABG history of type 2 diabetes Mother age 32 with a history of a cerebral aneurysm also has type 2 diabetes and COPD One brother one sister in good health Brother with impaired hearing  Lab Results  Component Value Date   HGBA1C 8.8* 07/17/2015   Past Medical History  Diagnosis Date  . GERD (gastroesophageal reflux disease)   . Hyperlipemia   . Diabetes mellitus   . Hypertension     Social History   Social History  . Marital Status: Married    Spouse Name: N/A  . Number of Children: 2  . Years of Education: N/A   Occupational History  . Social Worker    Social History Main Topics  . Smoking status: Never Smoker   . Smokeless tobacco: Never Used  . Alcohol Use: No  . Drug Use: No  . Sexual Activity: Not on file   Other Topics Concern  . Not on file   Social History Narrative   One caffeine drink daily     Past Surgical History  Procedure Laterality Date  . Tubal ligation      Family History  Problem Relation Age of Onset  . Colon cancer Neg Hx   . Stomach cancer Neg Hx   . Esophageal cancer Neg Hx   . Breast cancer      Maternal Great Aunt   . Diabetes Father     and Mother  . Heart disease Father     No Known Allergies  Current Outpatient Prescriptions on File Prior to Visit  Medication Sig Dispense Refill  . atorvastatin (LIPITOR) 40 MG tablet TAKE 1 TABLET (40 MG TOTAL) BY MOUTH DAILY. ONE TABLET BY MOUTH ONCE DAILY 90 tablet 1  . benazepril-hydrochlorthiazide (LOTENSIN HCT) 10-12.5 MG per tablet TAKE 1 TABLET BY MOUTH DAILY. 90 tablet 2  . esomeprazole (NEXIUM) 40 MG capsule Take 1 capsule (40 mg total) by mouth daily before breakfast. As needed 90 capsule 3  . glimepiride (AMARYL) 2 MG tablet Take 1 tablet by mouth 2 (two) times daily.  3  . glucose blood (ONETOUCH VERIO) test strip Use as instructed to check blood sugar 2 times per day dx code E11.9 100 each 3  . ibuprofen (ADVIL,MOTRIN) 800 MG tablet Take 1 tablet (800 mg total) by mouth as needed. 90 tablet 3  . Insulin Pen Needle (NOVOTWIST) 32G X 5 MM MISC Use one per day to inject Victoza 50 each 3  . INVOKANA 300 MG TABS tablet TAKE 1 TABLET BY MOUTH DAILY 90 tablet 0  . Liraglutide (VICTOZA) 18 MG/3ML SOPN Inject 1.2 mg daily 2 pen 3  . LORazepam (  ATIVAN) 0.5 MG tablet Take 1 tablet (0.5 mg total) by mouth 2 (two) times daily as needed for anxiety. 60 tablet 3  . metFORMIN (GLUCOPHAGE-XR) 500 MG 24 hr tablet TAKE 4 TABLETS BY MOUTH DAILY WITH SUPPER 120 tablet 3  . Na Sulfate-K Sulfate-Mg Sulf SOLN Suprep (no substitutions)-TAKE AS DIRECTED. 354 mL 0   No current facility-administered medications on file prior to visit.    BP 138/94 mmHg  Pulse 76  Temp(Src) 97.9 F (36.6 C) (Oral)  Resp 20  Ht 5\' 5"  (1.651 m)  Wt 220 lb (99.791 kg)  BMI 36.61 kg/m2  SpO2 98%  LMP 03/15/2013     Review of Systems  Constitutional: Negative.   HENT: Negative for congestion, dental problem, hearing loss, rhinorrhea, sinus pressure, sore throat and tinnitus.   Eyes: Negative for pain, discharge and visual disturbance.  Respiratory: Negative for cough and shortness of breath.     Cardiovascular: Negative for chest pain, palpitations and leg swelling.  Gastrointestinal: Negative for nausea, vomiting, abdominal pain, diarrhea, constipation, blood in stool and abdominal distention.  Genitourinary: Negative for dysuria, urgency, frequency, hematuria, flank pain, vaginal bleeding, vaginal discharge, difficulty urinating, vaginal pain and pelvic pain.  Musculoskeletal: Negative for joint swelling, arthralgias and gait problem.  Skin: Negative for rash.  Neurological: Negative for dizziness, syncope, speech difficulty, weakness, numbness and headaches.  Hematological: Negative for adenopathy.  Psychiatric/Behavioral: Negative for behavioral problems, dysphoric mood and agitation. The patient is not nervous/anxious.        Objective:   Physical Exam  Constitutional: She is oriented to person, place, and time. She appears well-developed and well-nourished.  Repeat blood pressure 126/82 Weight 220  HENT:  Head: Normocephalic and atraumatic.  Right Ear: External ear normal.  Left Ear: External ear normal.  Mouth/Throat: Oropharynx is clear and moist.  Eyes: Conjunctivae and EOM are normal.  Neck: Normal range of motion. Neck supple. No JVD present. No thyromegaly present.  Cardiovascular: Normal rate, regular rhythm, normal heart sounds and intact distal pulses.   No murmur heard. Pulmonary/Chest: Effort normal and breath sounds normal. She has no wheezes. She has no rales.  Abdominal: Soft. Bowel sounds are normal. She exhibits no distension and no mass. There is no tenderness. There is no rebound and no guarding.  Musculoskeletal: Normal range of motion. She exhibits no edema or tenderness.  Neurological: She is alert and oriented to person, place, and time. She has normal reflexes. No cranial nerve deficit. She exhibits normal muscle tone. Coordination normal.  Skin: Skin is warm and dry. No rash noted.  Psychiatric: She has a normal mood and affect. Her behavior is  normal.          Assessment & Plan:   Preventive health examination Diabetes mellitus.  Suboptimal control.  Follow-up endocrinology.  Compliance with her medications.  Encouraged Essential hypertension, stable Dyslipidemia.  Continue statin therapy  Screening colonoscopy as scheduled

## 2015-07-28 ENCOUNTER — Encounter: Payer: BLUE CROSS/BLUE SHIELD | Admitting: Internal Medicine

## 2015-08-02 ENCOUNTER — Encounter: Payer: Self-pay | Admitting: Internal Medicine

## 2015-08-02 ENCOUNTER — Ambulatory Visit (AMBULATORY_SURGERY_CENTER): Payer: BLUE CROSS/BLUE SHIELD | Admitting: Internal Medicine

## 2015-08-02 VITALS — BP 114/75 | HR 51 | Temp 96.9°F | Resp 10 | Ht 65.0 in | Wt 214.0 lb

## 2015-08-02 DIAGNOSIS — D123 Benign neoplasm of transverse colon: Secondary | ICD-10-CM | POA: Diagnosis not present

## 2015-08-02 DIAGNOSIS — K635 Polyp of colon: Secondary | ICD-10-CM

## 2015-08-02 DIAGNOSIS — Z1211 Encounter for screening for malignant neoplasm of colon: Secondary | ICD-10-CM | POA: Diagnosis not present

## 2015-08-02 DIAGNOSIS — D128 Benign neoplasm of rectum: Secondary | ICD-10-CM

## 2015-08-02 DIAGNOSIS — K621 Rectal polyp: Secondary | ICD-10-CM | POA: Diagnosis not present

## 2015-08-02 LAB — GLUCOSE, CAPILLARY
Glucose-Capillary: 145 mg/dL — ABNORMAL HIGH (ref 65–99)
Glucose-Capillary: 120 mg/dL — ABNORMAL HIGH (ref 65–99)

## 2015-08-02 MED ORDER — SODIUM CHLORIDE 0.9 % IV SOLN: 500.0000 mL | INTRAVENOUS | Status: DC

## 2015-08-02 NOTE — Progress Notes (Signed)
Called to room to assist during endoscopic procedure.  Patient ID and intended procedure confirmed with present staff. Received instructions for my participation in the procedure from the performing physician.  

## 2015-08-02 NOTE — Progress Notes (Signed)
A/ox3 pleased with MAC, report to Sheila RN 

## 2015-08-02 NOTE — Patient Instructions (Signed)
YOU HAD AN ENDOSCOPIC PROCEDURE TODAY AT THE [facility]:   Refer to the procedure report that was given to you for any specific questions about what was found during the examination.  If the procedure report does not answer your questions, please call your gastroenterologist to clarify.  If you requested that your care partner not be given the details of your procedure findings, then the procedure report has been included in a sealed envelope for you to review at your convenience later.  YOU SHOULD EXPECT: Some feelings of bloating in the abdomen. Passage of more gas than usual.  Walking can help get rid of the air that was put into your GI tract during the procedure and reduce the bloating. If you had a lower endoscopy (such as a colonoscopy or flexible sigmoidoscopy) you may notice spotting of blood in your stool or on the toilet paper. If you underwent a bowel prep for your procedure, you may not have a normal bowel movement for a few days.  Please Note:  You might notice some irritation and congestion in your nose or some drainage.  This is from the oxygen used during your procedure.  There is no need for concern and it should clear up in a day or so.  SYMPTOMS TO REPORT IMMEDIATELY:   Following lower endoscopy (colonoscopy or flexible sigmoidoscopy):  Excessive amounts of blood in the stool  Significant tenderness or worsening of abdominal pains  Swelling of the abdomen that is new, acute  Fever of 100F or higher   For urgent or emergent issues, a gastroenterologist can be reached at any hour by calling [phone].   DIET: Your first meal following the procedure should be a small meal and then it is ok to progress to your normal diet. Heavy or fried foods are harder to digest and may make you feel nauseous or bloated.  Likewise, meals heavy in dairy and vegetables can increase bloating.  Drink plenty of fluids but you should avoid alcoholic beverages for 24  hours.  ACTIVITY:  You should plan to take it easy for the rest of today and you should NOT DRIVE or use heavy machinery until tomorrow (because of the sedation medicines used during the test).    FOLLOW UP: Our staff will call the number listed on your records the next business day following your procedure to check on you and address any questions or concerns that you may have regarding the information given to you following your procedure. If we do not reach you, we will leave a message.  However, if you are feeling well and you are not experiencing any problems, there is no need to return our call.  We will assume that you have returned to your regular daily activities without incident.  If any biopsies were taken you will be contacted by phone or by letter within the next 1-3 weeks.  Please call us at [phone] if you have not heard about the biopsies in 3 weeks.    SIGNATURES/CONFIDENTIALITY: You and/or your care partner have signed paperwork which will be entered into your electronic medical record.  These signatures attest to the fact that that the information above on your After Visit Summary has been reviewed and is understood.  Full responsibility of the confidentiality of this discharge information lies with you and/or your care-partner.    Resume medications. Information given on polyps. 

## 2015-08-02 NOTE — Op Note (Signed)
[facility]  [address] [place], [address]   COLONOSCOPY PROCEDURE REPORT  PATIENT: Carla Williams, Carla Williams  MR#: [id number] BIRTHDATE: January 09, 1966 , 55  yrs. old GENDER: female ENDOSCOPIST: Eustace Quail, MD REFERRED [id number] Ronita Hipps, M.D. PROCEDURE DATE:  08/02/2015 PROCEDURE:   Colonoscopy, screening and Colonoscopy with snare polypectomy x 2 First Screening Colonoscopy - Avg.  risk and is 50 yrs.  old or older Yes.  Prior Negative Screening - Now for repeat screening. N/A  History of Adenoma - Now for follow-up colonoscopy & has been > or = to 3 yrs.  N/A  Polyps removed today? Yes ASA CLASS:   Class II INDICATIONS:Screening for colonic neoplasia and Colorectal Neoplasm Risk Assessment for this procedure is average risk. MEDICATIONS: Monitored anesthesia care and Propofol 300 mg IV  DESCRIPTION OF PROCEDURE:   After the risks benefits and alternatives of the procedure were thoroughly explained, informed consent was obtained.  The digital rectal exam revealed no abnormalities of the rectum.   The LB [id number]  endoscope was introduced through the anus and advanced to the cecum, which was identified by both the appendix and ileocecal valve. No adverse events experienced.   The quality of the prep was excellent. (Suprep was used)  The instrument was then slowly withdrawn as the colon was fully examined. Estimated blood loss is zero unless otherwise noted in this procedure report.   COLON FINDINGS: Two polyps were found in the transverse colon(5 mm sessile) and rectum(4 mm).  A polypectomy was performed with a cold snare.  The resection was complete, the polyp tissue was completely retrieved and sent to histology.   There was an isolated diverticulum just outside of the cecum.   The examination was otherwise normal.  Retroflexed views revealed internal hemorrhoids. The time to cecum = 2.7 Withdrawal time = 14.0   The scope was withdrawn and the procedure  completed. COMPLICATIONS: There were no immediate complications.  ENDOSCOPIC IMPRESSION: 1.   Two polyps were found in the transverse colon and rectum; polypectomy was performed with a cold snare 2.   Diverticulum (isolated) right colon 3.   The examination was otherwise normal  RECOMMENDATIONS: 1. Repeat colonoscopy in 5 years if polyp adenomatous; otherwise 10 years  eSigned:  Eustace Quail, MD 08/02/2015 10:02 AM   cc: The Patient, Brien Few, MD, and Marletta Lor, MD

## 2015-08-03 ENCOUNTER — Telehealth: Payer: Self-pay | Admitting: *Deleted

## 2015-08-03 NOTE — Telephone Encounter (Signed)
  Follow up Call-  Call back number 08/02/2015  Post procedure Call Back phone  # [phone]  Permission to leave phone message Yes     Patient questions:  Do you have a fever, pain , or abdominal swelling? No. Pain Score  0 *  Have you tolerated food without any problems? Yes.    Have you been able to return to your normal activities? Yes.    Do you have any questions about your discharge instructions: Diet   No. Medications  No. Follow up visit  No.  Do you have questions or concerns about your Care? No.  Actions: * If pain score is 4 or above: No action needed, pain <4.

## 2015-08-09 ENCOUNTER — Encounter: Payer: Self-pay | Admitting: Internal Medicine

## 2015-08-11 ENCOUNTER — Other Ambulatory Visit: Payer: Self-pay | Admitting: Internal Medicine

## 2015-08-11 ENCOUNTER — Other Ambulatory Visit: Payer: Self-pay | Admitting: Endocrinology

## 2015-08-23 ENCOUNTER — Encounter: Payer: Self-pay | Admitting: Endocrinology

## 2015-08-23 ENCOUNTER — Ambulatory Visit (INDEPENDENT_AMBULATORY_CARE_PROVIDER_SITE_OTHER): Payer: BLUE CROSS/BLUE SHIELD | Admitting: Endocrinology

## 2015-08-23 VITALS — BP 132/84 | HR 73 | Temp 97.9°F | Ht 65.0 in | Wt 221.0 lb

## 2015-08-23 DIAGNOSIS — E785 Hyperlipidemia, unspecified: Secondary | ICD-10-CM

## 2015-08-23 DIAGNOSIS — E1165 Type 2 diabetes mellitus with hyperglycemia: Secondary | ICD-10-CM | POA: Diagnosis not present

## 2015-08-23 MED ORDER — LIRAGLUTIDE 18 MG/3ML ~~LOC~~ SOPN: PEN_INJECTOR | SUBCUTANEOUS | Status: DC

## 2015-08-23 NOTE — Progress Notes (Signed)
Patient ID: Carla Williams, female   DOB: 1966/03/02, 50 y.o.   MRN: [id number]    Reason for Appointment : Follow-up for Type 2 Diabetes  History of Present Illness          Diagnosis: Type 2 diabetes mellitus, date of diagnosis: 2007      Past history: She was initially told to have mild diabetes and advised to lose weight with diet and exercise. About a year later because of higher blood sugars she was started on metformin low dose and this was increased. However because of lack of adequate control she was given Amaryl in addition soon after She thinks her blood sugars and overall were subsequently controlled with A1c as low as around 6% but records are not available In 2014 blood sugars apparently started increasing significantly. She was initially given Januvia but subsequently in 11/14 she still had a high A1c She has taken Invokana since 11/14 which was started because of a high A1c of 9.1%  Recent history:   She was started on Victoza in 12/15 because of persistently high A1c of 8%. She had significant improvement in her A1c with this  On her last visit she was taking 0.9 mg  She has been noncompliant with her follow-up and has not been seen since 7/16 Also she now says that since October 2016 he has not taken Victoza for no particular reason  A1c done by PCP was 8.8% and she is referred here for further management  Current blood sugar patterns and problems  She still has not checked her blood sugar as discussed on her last visit  She forgets to take medications and evenings and has taken only metformin 2 tablets in the morning until recently when she went back to 4 tablets  She thinks she is exercising regularly but her weight is the same as before  compared to previous level of 8%  She previously had difficulty getting better tolerability of her Victoza because of nausea and sluggishness with 1.2 mg and was taking this at night but tends to forget  medications in the evenings Tolerating Invokana without side effects  Recently has not been doing much exercise but has lost a little weight and she thinks she generally watching her diet and tries to limit carbohydrates       Oral hypoglycemic drugs the patient is taking are: Invokana 300 mg, Amaryl qd , metformin 2-4      Side effects from medications have been: None   Glucose monitoring:  not done         Glucometer: One Touch .      Blood Glucose readings  none recently  Glycemic control:   Lab Results  Component Value Date   HGBA1C 8.8* 07/17/2015   HGBA1C 7.0* 01/19/2015   HGBA1C 6.5 09/29/2014   Lab Results  Component Value Date   MICROALBUR <0.7 07/17/2015   LDLCALC 131* 07/17/2015   CREATININE 0.74 07/17/2015    Self-care: The diet that the patient has been following is: tries to limit carbohydrate and fruits .      Meals: 2-3 meals per day. No breakfast; Lunch may be larger.            Exercise: is doing aerobic dancing 3/7         Dietician visit:  05/2014              Weight history:  Wt Readings from Last 3 Encounters:  08/23/15 221 lb (100.245  kg)  08/02/15 214 lb (97.07 kg)  07/27/15 220 lb (99.791 kg)      Medication List       This list is accurate as of: 08/23/15  8:59 PM.  Always use your most recent med list.               atorvastatin 40 MG tablet  Commonly known as:  LIPITOR  TAKE 1 TABLET (40 MG TOTAL) BY MOUTH DAILY. ONE TABLET BY MOUTH ONCE DAILY     benazepril-hydrochlorthiazide 10-12.5 MG tablet  Commonly known as:  LOTENSIN HCT  TAKE 1 TABLET BY MOUTH DAILY.     esomeprazole 40 MG capsule  Commonly known as:  NEXIUM  Take 1 capsule (40 mg total) by mouth daily before breakfast. As needed     glimepiride 2 MG tablet  Commonly known as:  AMARYL  Take 1 tablet by mouth 2 (two) times daily.     glucose blood test strip  Commonly known as:  ONETOUCH VERIO  Use as instructed to check blood sugar 2 times per day dx code E11.9       ibuprofen 800 MG tablet  Commonly known as:  ADVIL,MOTRIN  Take 1 tablet (800 mg total) by mouth as needed.     Insulin Pen Needle 32G X 5 MM Misc  Commonly known as:  NOVOTWIST  Use one per day to inject Victoza     INVOKANA 300 MG Tabs tablet  Generic drug:  canagliflozin  TAKE 1 TABLET BY MOUTH DAILY     Liraglutide 18 MG/3ML Sopn  Commonly known as:  VICTOZA  Inject 1.2 mg daily     LORazepam 0.5 MG tablet  Commonly known as:  ATIVAN  Take 1 tablet (0.5 mg total) by mouth 2 (two) times daily as needed for anxiety.     metFORMIN 500 MG 24 hr tablet  Commonly known as:  GLUCOPHAGE-XR  TAKE 4 TABLETS BY MOUTH DAILY WITH SUPPER        Allergies: No Known Allergies  Past Medical History  Diagnosis Date  . GERD (gastroesophageal reflux disease)   . Hyperlipemia   . Diabetes mellitus   . Hypertension     Past Surgical History  Procedure Laterality Date  . Tubal ligation      Family History  Problem Relation Age of Onset  . Colon cancer Neg Hx   . Stomach cancer Neg Hx   . Esophageal cancer Neg Hx   . Breast cancer      Maternal Great Aunt   . Diabetes Father     and Mother  . Heart disease Father     Social History:  reports that she has never smoked. She has never used smokeless tobacco. She reports that she does not drink alcohol or use illicit drugs.    Review of Systems       Lipids: She has been on Lipitor from her PCP  She thinks she is taking this regularly but her LDL is higher than usual, she was not told to change any treatment by PCP   Lab Results  Component Value Date   CHOL 209* 07/17/2015   CHOL 178 07/12/2014   CHOL 185 06/21/2014   Lab Results  Component Value Date   HDL 54.80 07/17/2015   HDL 49.60 07/12/2014   HDL 47.70 06/21/2014   Lab Results  Component Value Date   LDLCALC 131* 07/17/2015   LDLCALC 114* 07/12/2014   LDLCALC 110* 06/21/2014   Lab  Results  Component Value Date   TRIG 116.0 07/17/2015   TRIG 74.0  07/12/2014   TRIG 135.0 06/21/2014   Lab Results  Component Value Date   CHOLHDL 4 07/17/2015   CHOLHDL 4 07/12/2014   CHOLHDL 4 06/21/2014   Lab Results  Component Value Date   LDLDIRECT 100.0 09/29/2014                 Lab Results  Component Value Date   ALT 33 07/17/2015       The blood pressure has been controlled with Lotensin HCT  Recently seen by PCP       Physical Examination:  BP 132/84 mmHg  Pulse 73  Temp(Src) 97.9 F (36.6 C) (Oral)  Ht 5\' 5"  (1.651 m)  Wt 221 lb (100.245 kg)  BMI 36.78 kg/m2  SpO2 98%  LMP 03/15/2013   Diabetes type 2, uncontrolled     See history of present illness for detailed discussion of current diabetes management, blood sugar patterns and problems identified Her blood sugars are poorly controlled with her not taking Victoza for the last 4-5 months Also had not been taking her full dose of metformin  Although she may be able to get back into good control with the resuming Victoza not clear if she is having some progression of her diabetes as A1c is unusually high at 8.8% She is not very motivated to check her blood sugar and take medications consistently Also can do better on diet  Discussed A1c targets and that sugar targets at various times, likely that she has postprandial hyperglycemia since lab fasting glucose is not significantly high  HYPERLIPIDEMIA:  She thinks she is taking Lipitor but most likely is not regular with this as her LDL has gone up for no reason Last ALT was normal   PLAN:   She may benefit from Emlyn again and discussed starting with 0.6 and titrating very gradually with doing one click per day increase  and showed on the demonstration pen  how to do it  Restart glucose monitoring, discussed timing and targets of blood sugars, she will make sure she has on expired test strips More consistent diet Will need to see her back in short-term follow-up She will check to see if she is taking Lipitor  consistently Continue to take full dose of metformin May consider Bydureon if she continues to have nausea with Victoza Consultation with the dietitian may be needed   Counseling time on subjects discussed above is over 50% of today's 25 minute visit  Patient Instructions  Check blood sugars on waking up 2-3  times a week Also check blood sugars about 2 hours after a meal and do this after different meals by rotation  Recommended blood sugar levels on waking up is 90-130 and about 2 hours after meal is 130-160  Please bring your blood sugar monitor to each visit, thank you  Victoza 0.6mg  daily for 4-5 days, then go up 1 click per day till nausea occurs       Carla Williams 08/23/2015, 8:59 PM

## 2015-08-23 NOTE — Patient Instructions (Addendum)
Check blood sugars on waking up 2-3  times a week Also check blood sugars about 2 hours after a meal and do this after different meals by rotation  Recommended blood sugar levels on waking up is 90-130 and about 2 hours after meal is 130-160  Please bring your blood sugar monitor to each visit, thank you  Victoza 0.6mg  daily for 4-5 days, then go up 1 click per day till nausea occurs

## 2015-08-27 ENCOUNTER — Other Ambulatory Visit: Payer: Self-pay | Admitting: Endocrinology

## 2015-08-28 ENCOUNTER — Other Ambulatory Visit: Payer: Self-pay | Admitting: Endocrinology

## 2015-08-29 ENCOUNTER — Other Ambulatory Visit: Payer: Self-pay | Admitting: *Deleted

## 2015-08-29 MED ORDER — ONETOUCH DELICA LANCETS 33G MISC: Status: AC

## 2015-10-12 ENCOUNTER — Other Ambulatory Visit: Payer: Self-pay | Admitting: Internal Medicine

## 2015-10-12 ENCOUNTER — Other Ambulatory Visit: Payer: Self-pay | Admitting: Endocrinology

## 2015-11-06 ENCOUNTER — Other Ambulatory Visit: Payer: Self-pay | Admitting: Endocrinology

## 2015-11-10 ENCOUNTER — Telehealth: Payer: Self-pay | Admitting: Internal Medicine

## 2015-11-10 NOTE — Telephone Encounter (Signed)
Pt states her reflux is worse and she wanted to be seen sooner than June. Pt scheduled to see Dr. Henrene Pastor 11/13/15@10 :15am. Pt aware of appt.

## 2015-11-13 ENCOUNTER — Ambulatory Visit (INDEPENDENT_AMBULATORY_CARE_PROVIDER_SITE_OTHER): Payer: BLUE CROSS/BLUE SHIELD | Admitting: Internal Medicine

## 2015-11-13 ENCOUNTER — Encounter: Payer: Self-pay | Admitting: Internal Medicine

## 2015-11-13 VITALS — BP 140/90 | HR 80 | Ht 65.0 in | Wt 221.6 lb

## 2015-11-13 DIAGNOSIS — R112 Nausea with vomiting, unspecified: Secondary | ICD-10-CM | POA: Diagnosis not present

## 2015-11-13 DIAGNOSIS — K219 Gastro-esophageal reflux disease without esophagitis: Secondary | ICD-10-CM

## 2015-11-13 NOTE — Patient Instructions (Signed)

## 2015-11-13 NOTE — Progress Notes (Signed)
HISTORY OF PRESENT ILLNESS:  Carla Williams is a 50 y.o. female who refers herself today with a chief complaint of worsening reflux. She was last seen 08/02/2015 for routine screening colonoscopy. Found to have non-adenomatous polyps. Follow-up in 10 years recommended. She does have a long-standing history of diabetes mellitus. Last hemoglobin A1c 8.8. She takes Nexium daily for GERD. 4 days ago she will cup with nausea and water brash. For that she treats with pickle juice. She was having some burning. She did vomit undigested food. Symptoms lingered through the weekend. Somewhat better today. She describes a sensation of poor digestion or food sitting on her stomach. There is postprandial bloating. Weight is been stable. No other GI complaints.  REVIEW OF SYSTEMS:  All non-GI ROS negative on review  Past Medical History  Diagnosis Date  . GERD (gastroesophageal reflux disease)   . Hyperlipemia   . Diabetes mellitus   . Hypertension   . Obesity   . Colon polyps     hyperplastic    Past Surgical History  Procedure Laterality Date  . Tubal ligation      Social History Carla Williams  reports that she has never smoked. She has never used smokeless tobacco. She reports that she does not drink alcohol or use illicit drugs.  family history includes Diabetes in her father; Heart disease in her father. There is no history of Colon cancer, Stomach cancer, or Esophageal cancer.  No Known Allergies     PHYSICAL EXAMINATION: Vital signs: BP 140/90 mmHg  Pulse 80  Ht 5\' 5"  (1.651 m)  Wt 221 lb 9.6 oz (100.517 kg)  BMI 36.88 kg/m2  LMP 03/15/2013  Constitutional: Pleasant, obese, generally well-appearing, no acute distress Psychiatric: alert and oriented x3, cooperative Eyes: extraocular movements intact, anicteric, conjunctiva pink Mouth: oral pharynx moist, no lesions Neck: supple no lymphadenopathy Cardiovascular: heart regular rate and rhythm, no murmur Lungs: clear to  auscultation bilaterally Abdomen: soft, obese, nontender, nondistended, no obvious ascites, no peritoneal signs, normal bowel sounds, no organomegaly. No succussion splash Rectal: Omitted. Normal in February Extremities: no clubbing cyanosis or lower extremity edema bilaterally Skin: no lesions on visible extremities Neuro: No focal deficits. Normal DTRs  ASSESSMENT:  #1. GERD. Exacerbated #2. Problems with nausea and vomiting as described. Suspect an element of gastroparesis #3. Poorly controlled diabetes. Likely contributing #4. Obesity. Likely contributing #5. Non-adenomatous colon polyps February 2017   PLAN:  #1. Reflux precautions with attention to weight loss #2. Continue PPI #3. Upper endoscopy to evaluate symptoms and rule out other causes such as ulcer.The nature of the procedure, as well as the risks, benefits, and alternatives were carefully and thoroughly reviewed with the patient. Ample time for discussion and questions allowed. The patient understood, was satisfied, and agreed to proceed. #4. Importance of good diabetic control stressed #5. Repeat screening colonoscopy 2027

## 2015-11-17 ENCOUNTER — Encounter: Payer: Self-pay | Admitting: Internal Medicine

## 2015-12-01 ENCOUNTER — Ambulatory Visit (AMBULATORY_SURGERY_CENTER): Payer: BLUE CROSS/BLUE SHIELD | Admitting: Internal Medicine

## 2015-12-01 ENCOUNTER — Encounter: Payer: Self-pay | Admitting: Internal Medicine

## 2015-12-01 VITALS — BP 100/64 | HR 64 | Temp 96.6°F | Resp 9 | Ht 65.0 in | Wt 221.0 lb

## 2015-12-01 DIAGNOSIS — K219 Gastro-esophageal reflux disease without esophagitis: Secondary | ICD-10-CM | POA: Diagnosis not present

## 2015-12-01 DIAGNOSIS — R112 Nausea with vomiting, unspecified: Secondary | ICD-10-CM

## 2015-12-01 LAB — GLUCOSE, CAPILLARY
GLUCOSE-CAPILLARY: 113 mg/dL — AB (ref 65–99)
Glucose-Capillary: 146 mg/dL — ABNORMAL HIGH (ref 65–99)

## 2015-12-01 MED ORDER — SODIUM CHLORIDE 0.9 % IV SOLN: 500.0000 mL | INTRAVENOUS | Status: DC

## 2015-12-01 NOTE — Patient Instructions (Signed)
YOU HAD AN ENDOSCOPIC PROCEDURE TODAY AT [facility]:   Refer to the procedure report that was given to you for any specific questions about what was found during the examination.  If the procedure report does not answer your questions, please call your gastroenterologist to clarify.  If you requested that your care partner not be given the details of your procedure findings, then the procedure report has been included in a sealed envelope for you to review at your convenience later.  YOU SHOULD EXPECT: Some feelings of bloating in the abdomen. Passage of more gas than usual.  Walking can help get rid of the air that was put into your GI tract during the procedure and reduce the bloating. If you had a lower endoscopy (such as a colonoscopy or flexible sigmoidoscopy) you may notice spotting of blood in your stool or on the toilet paper. If you underwent a bowel prep for your procedure, you may not have a normal bowel movement for a few days.  Please Note:  You might notice some irritation and congestion in your nose or some drainage.  This is from the oxygen used during your procedure.  There is no need for concern and it should clear up in a day or so.  SYMPTOMS TO REPORT IMMEDIATELY:   Following upper endoscopy (EGD)  Vomiting of blood or coffee ground material  New chest pain or pain under the shoulder blades  Painful or persistently difficult swallowing  New shortness of breath  Fever of 100F or higher  Black, tarry-looking stools  For urgent or emergent issues, a gastroenterologist can be reached at any hour by calling [phone].   DIET: Your first meal following the procedure should be a small meal and then it is ok to progress to your normal diet. Heavy or fried foods are harder to digest and may make you feel nauseous or bloated.  Likewise, meals heavy in dairy and vegetables can increase bloating.  Drink plenty of fluids but you should avoid alcoholic beverages for  24 hours.  ACTIVITY:  You should plan to take it easy for the rest of today and you should NOT DRIVE or use heavy machinery until tomorrow (because of the sedation medicines used during the test).    FOLLOW UP: Our staff will call the number listed on your records the next business day following your procedure to check on you and address any questions or concerns that you may have regarding the information given to you following your procedure. If we do not reach you, we will leave a message.  However, if you are feeling well and you are not experiencing any problems, there is no need to return our call.  We will assume that you have returned to your regular daily activities without incident.  If any biopsies were taken you will be contacted by phone or by letter within the next 1-3 weeks.  Please call us at [phone] if you have not heard about the biopsies in 3 weeks.    SIGNATURES/CONFIDENTIALITY: You and/or your care partner have signed paperwork which will be entered into your electronic medical record.  These signatures attest to the fact that that the information above on your After Visit Summary has been reviewed and is understood.  Full responsibility of the confidentiality of this discharge information lies with you and/or your care-partner.  Normal exam.  GERD-handout given.

## 2015-12-01 NOTE — Progress Notes (Signed)
Report given to PACU RN, vss 

## 2015-12-01 NOTE — Op Note (Signed)
[facility] Patient Name: Carla Williams Procedure Date: 12/01/2015 10:05 AM MRN: [id number] Endoscopist: Docia Chuck. Henrene Pastor , MD Age: 50 Referring MD:  Date of Birth: 02/05/1966 Gender: Female Procedure:                Upper GI endoscopy Indications:              Suspected esophageal reflux, Nausea with vomiting Medicines:                Monitored Anesthesia Care Procedure:                Pre-Anesthesia Assessment:                           - Prior to the procedure, a History and Physical                            was performed, and patient medications and                            allergies were reviewed. The patient's tolerance of                            previous anesthesia was also reviewed. The risks                            and benefits of the procedure and the sedation                            options and risks were discussed with the patient.                            All questions were answered, and informed consent                            was obtained. Prior Anticoagulants: The patient has                            taken no previous anticoagulant or antiplatelet                            agents. ASA Grade Assessment: II - A patient with                            mild systemic disease. After reviewing the risks                            and benefits, the patient was deemed in                            satisfactory condition to undergo the procedure.                           After obtaining informed consent, the endoscope was  passed under direct vision. Throughout the                            procedure, the patient's blood pressure, pulse, and                            oxygen saturations were monitored continuously. The                            Model [id number]-[id number]) scope was introduced                            through the mouth, and advanced to the second part                            of duodenum. The upper GI  endoscopy was                            accomplished without difficulty. The patient                            tolerated the procedure well. Scope In: Scope Out: Findings:                 The esophagus was normal.                           The stomach was normal.                           The examined duodenum was normal.                           The cardia and gastric fundus were normal on                            retroflexion. Complications:            No immediate complications. Estimated Blood Loss:     Estimated blood loss: none. Impression:               - Normal EGD                           ?" GERD. Recommendation:           - Reflux precautions.                           - Good diabetic control.                           - Continue present medications. Docia Chuck. Henrene Pastor, MD 12/01/2015 10:27:55 AM This report has been signed electronically.

## 2015-12-04 ENCOUNTER — Telehealth: Payer: Self-pay

## 2015-12-04 NOTE — Telephone Encounter (Signed)
  Follow up Call-  Call back number 12/01/2015 08/02/2015  Post procedure Call Back phone  # [phone]  Permission to leave phone message Yes Yes     Patient questions:  Do you have a fever, pain , or abdominal swelling? No. Pain Score  0 *  Have you tolerated food without any problems? Yes.    Have you been able to return to your normal activities? Yes.    Do you have any questions about your discharge instructions: Diet   No. Medications  No. Follow up visit  No.  Do you have questions or concerns about your Care? No.  Actions: * If pain score is 4 or above: No action needed, pain <4.  No problems per the pt. maw

## 2015-12-21 ENCOUNTER — Ambulatory Visit: Payer: BLUE CROSS/BLUE SHIELD | Admitting: Internal Medicine

## 2016-01-01 ENCOUNTER — Other Ambulatory Visit: Payer: Self-pay

## 2016-01-01 MED ORDER — GLIMEPIRIDE 2 MG PO TABS: ORAL_TABLET | ORAL | Status: DC

## 2016-01-01 MED ORDER — CANAGLIFLOZIN 300 MG PO TABS
300.0000 mg | ORAL_TABLET | Freq: Every day | ORAL | Status: DC
Start: 2016-01-01 — End: 2016-03-27

## 2016-01-26 ENCOUNTER — Other Ambulatory Visit: Payer: Self-pay | Admitting: Endocrinology

## 2016-02-17 ENCOUNTER — Other Ambulatory Visit: Payer: Self-pay | Admitting: Internal Medicine

## 2016-02-20 NOTE — Telephone Encounter (Signed)
Call in #[phone] with no rf 

## 2016-02-20 NOTE — Telephone Encounter (Signed)
Rx refill sent to pharmacy. 

## 2016-03-09 ENCOUNTER — Other Ambulatory Visit: Payer: Self-pay | Admitting: Endocrinology

## 2016-03-13 ENCOUNTER — Other Ambulatory Visit: Payer: Self-pay | Admitting: Obstetrics and Gynecology

## 2016-03-13 DIAGNOSIS — Z1231 Encounter for screening mammogram for malignant neoplasm of breast: Secondary | ICD-10-CM

## 2016-03-22 ENCOUNTER — Other Ambulatory Visit (INDEPENDENT_AMBULATORY_CARE_PROVIDER_SITE_OTHER): Payer: BLUE CROSS/BLUE SHIELD

## 2016-03-22 ENCOUNTER — Encounter: Payer: Self-pay | Admitting: Endocrinology

## 2016-03-22 DIAGNOSIS — E1165 Type 2 diabetes mellitus with hyperglycemia: Secondary | ICD-10-CM | POA: Diagnosis not present

## 2016-03-22 LAB — LIPID PANEL
CHOLESTEROL: 180 mg/dL (ref 0–200)
HDL: 53.8 mg/dL (ref >39.00)
LDL Cholesterol: 89 mg/dL (ref 0–99)
NonHDL: 126.02
TRIGLYCERIDES: 186 mg/dL — AB (ref 0.0–149.0)
Total CHOL/HDL Ratio: 3
VLDL: 37.2 mg/dL (ref 0.0–40.0)

## 2016-03-22 LAB — GLUCOSE, RANDOM: GLUCOSE: 178 mg/dL — AB (ref 70–99)

## 2016-03-23 LAB — FRUCTOSAMINE: Fructosamine: 322 umol/L — ABNORMAL HIGH (ref 0–285)

## 2016-03-27 ENCOUNTER — Ambulatory Visit
Admission: RE | Admit: 2016-03-27 | Discharge: 2016-03-27 | Disposition: A | Discharge status: Home / Self Care | Payer: BLUE CROSS/BLUE SHIELD | Source: Ambulatory Visit | Attending: Obstetrics and Gynecology | Admitting: Obstetrics and Gynecology

## 2016-03-27 ENCOUNTER — Encounter: Payer: Self-pay | Admitting: Endocrinology

## 2016-03-27 ENCOUNTER — Other Ambulatory Visit: Payer: Self-pay | Admitting: *Deleted

## 2016-03-27 ENCOUNTER — Ambulatory Visit (INDEPENDENT_AMBULATORY_CARE_PROVIDER_SITE_OTHER): Payer: BLUE CROSS/BLUE SHIELD | Admitting: Endocrinology

## 2016-03-27 VITALS — BP 120/82 | HR 78 | Temp 98.0°F | Resp 16 | Ht 65.0 in | Wt 219.0 lb

## 2016-03-27 DIAGNOSIS — I10 Essential (primary) hypertension: Secondary | ICD-10-CM

## 2016-03-27 DIAGNOSIS — E1165 Type 2 diabetes mellitus with hyperglycemia: Secondary | ICD-10-CM | POA: Diagnosis not present

## 2016-03-27 DIAGNOSIS — E119 Type 2 diabetes mellitus without complications: Secondary | ICD-10-CM

## 2016-03-27 DIAGNOSIS — E782 Mixed hyperlipidemia: Secondary | ICD-10-CM | POA: Diagnosis not present

## 2016-03-27 DIAGNOSIS — Z1231 Encounter for screening mammogram for malignant neoplasm of breast: Secondary | ICD-10-CM

## 2016-03-27 LAB — POCT GLYCOSYLATED HEMOGLOBIN (HGB A1C): Hemoglobin A1C: 8.1

## 2016-03-27 MED ORDER — CANAGLIFLOZIN-METFORMIN HCL ER 150-1000 MG PO TB24: 150.0000 mg | ORAL_TABLET | Freq: Every day | ORAL | 3 refills | Status: DC

## 2016-03-27 NOTE — Progress Notes (Signed)
Patient ID: Carla Williams, female   DOB: [birth year], 50 y.o.   MRN: [id number]    Reason for Appointment : Follow-up for Type 2 Diabetes  History of Present Illness          Diagnosis: Type 2 diabetes mellitus, date of diagnosis: 2007      Past history: She was initially told to have mild diabetes and advised to lose weight with diet and exercise. About a year later because of higher blood sugars she was started on metformin low dose and this was increased. However because of lack of adequate control she was given Amaryl in addition soon after She thinks her blood sugars and overall were subsequently controlled with A1c as low as around 6% but records are not available In 2014 blood sugars apparently started increasing significantly. She was initially given Januvia but subsequently in 11/14 she still had a high A1c She has taken Invokana since 11/14 which was started because of a high A1c of 9.1%  Recent history:   She was started on Victoza in 12/15 because of persistently high A1c of 8%. She had significant improvement in her A1c with this    She has been noncompliant with her follow-up again and has not been seen since 2/17 Also she now says that since June 2017 he has not taken Victoza, she had stopped taking this previously also on her own  Although her A1c had gone down to 7% in 7/16 it is now consistently over 8%, recently 8.1  Current blood sugar patterns and problems  She still has not checked her blood sugar and does not think she has a meter at home  She forgets to take medications consistently and does not like to take a lot of pills, does not always take all her metformin doses even though she does not have any side effects with this.  She thinks she is exercising but only on weekends  Her fasting glucose was high in the lab Tolerating Invokana without side effects  Recently has not been doing much exercise but has lost a little weight and she thinks she  generally watching her diet and tries to limit carbohydrates       Oral hypoglycemic drugs the patient is taking are: Invokana 300 mg, Amaryl qd  , metformin 2-4       Side effects from medications have been: None   Glucose monitoring:  not done         Glucometer: One Touch .      Blood Glucose readings  none recently  Glycemic control:   Lab Results  Component Value Date   HGBA1C 8.1 03/27/2016   HGBA1C 8.8 (H) 07/17/2015   HGBA1C 7.0 (H) 01/19/2015   Lab Results  Component Value Date   MICROALBUR <0.7 07/17/2015   LDLCALC 89 03/22/2016   CREATININE 0.74 07/17/2015    Self-care: The diet that the patient has been following is: tries to limit carbohydrate and fruits .      Meals: 2-3 meals per day. No breakfast; Lunch may be larger.            Exercise: is doing walking 2/7 Days a week       Dietician visit:  05/2014              Weight history:  Wt Readings from Last 3 Encounters:  03/27/16 219 lb (99.3 kg)  12/01/15 221 lb (100.2 kg)  11/13/15 221 lb 9.6 oz (100.5 kg)  Medication List       Accurate as of 03/27/16  5:06 PM. Always use your most recent med list.          atorvastatin 40 MG tablet Commonly known as:  LIPITOR TAKE 1 TABLET (40 MG TOTAL) BY MOUTH DAILY. ONE TABLET BY MOUTH ONCE DAILY   benazepril-hydrochlorthiazide 10-12.5 MG tablet Commonly known as:  LOTENSIN HCT TAKE 1 TABLET BY MOUTH DAILY.   canagliflozin 300 MG Tabs tablet Commonly known as:  INVOKANA Take 1 tablet (300 mg total) by mouth daily.   Canagliflozin-Metformin HCl ER [phone] MG Tb24 Commonly known as:  INVOKAMET XR Take 150-1,000 mg by mouth daily. Take 2 tablets daily   esomeprazole 40 MG capsule Commonly known as:  NEXIUM TAKE 1 CAPSULE (40 MG TOTAL) BY MOUTH DAILY BEFORE BREAKFAST. AS NEEDED   glimepiride 2 MG tablet Commonly known as:  AMARYL TAKE 1 TABLET (2 MG TOTAL) BY MOUTH 2 (TWO) TIMES DAILY.   ibuprofen 800 MG tablet Commonly known as:   ADVIL,MOTRIN TAKE 1 TABLET (800 MG TOTAL) BY MOUTH AS NEEDED.   Insulin Pen Needle 32G X 5 MM Misc Commonly known as:  NOVOTWIST Use one per day to inject Victoza   Liraglutide 18 MG/3ML Sopn Commonly known as:  VICTOZA Inject 1.2 mg daily   LORazepam 0.5 MG tablet Commonly known as:  ATIVAN Take 1 tablet (0.5 mg total) by mouth 2 (two) times daily as needed for anxiety.   metFORMIN 500 MG 24 hr tablet Commonly known as:  GLUCOPHAGE-XR TAKE 4 TABLETS BY MOUTH DAILY WITH SUPPER   ONETOUCH DELICA LANCETS 99991111 Misc Use to check blood sugar 2 times per day dx code E11.9   ONETOUCH VERIO test strip Generic drug:  glucose blood USE AS INSTRUCTED TO CHECK BLOOD SUGAR 2 TIMES PER DAY DX CODE E11.9       Allergies: No Known Allergies  Past Medical History:  Diagnosis Date  . Colon polyps    hyperplastic  . Diabetes mellitus   . GERD (gastroesophageal reflux disease)   . Hyperlipemia   . Hypertension   . Obesity     Past Surgical History:  Procedure Laterality Date  . TUBAL LIGATION      Family History  Problem Relation Age of Onset  . Colon cancer Neg Hx   . Stomach cancer Neg Hx   . Esophageal cancer Neg Hx   . Breast cancer      Maternal Great Aunt   . Diabetes Father     and Mother  . Heart disease Father     Social History:  reports that she has never smoked. She has never used smokeless tobacco. She reports that she does not drink alcohol or use drugs.    Review of Systems       Lipids: She has been on Lipitor from her PCP  She thinks she is taking this regularly   Lab Results  Component Value Date   CHOL 180 03/22/2016   CHOL 209 (H) 07/17/2015   CHOL 178 07/12/2014   Lab Results  Component Value Date   HDL 53.80 03/22/2016   HDL 54.80 07/17/2015   HDL 49.60 07/12/2014   Lab Results  Component Value Date   LDLCALC 89 03/22/2016   LDLCALC 131 (H) 07/17/2015   LDLCALC 114 (H) 07/12/2014   Lab Results  Component Value Date   TRIG 186.0  (H) 03/22/2016   TRIG 116.0 07/17/2015   TRIG 74.0 07/12/2014   Lab  Results  Component Value Date   CHOLHDL 3 03/22/2016   CHOLHDL 4 07/17/2015   CHOLHDL 4 07/12/2014   Lab Results  Component Value Date   LDLDIRECT 100.0 09/29/2014                 Lab Results  Component Value Date   ALT 33 07/17/2015       The blood pressure has been controlled with Lotensin HCT        Physical Examination:  BP 120/82   Pulse 78   Temp 98 F (36.7 C)   Resp 16   Ht 5\' 5"  (1.651 m)   Wt 219 lb (99.3 kg)   LMP 03/15/2013   SpO2 97%   BMI 36.44 kg/m    Diabetes type 2, uncontrolled     See history of present illness for detailed discussion of current diabetes management, blood sugar patterns and problems identified Her blood sugars are poorly controlled with her not taking Victoza and also not taking metformin as directed She is not motivated at all and does not check her blood sugars A1c is 8.1  HYPERLIPIDEMIA:  Better controlled now with improved compliance  PLAN:   She will go back to Victoza Since she may have had some nausea with 1.2 mg she can try 0.6 first, then increase it to 0.9 and if tolerated go up to 1.2 mg in 2 weeks.  This was shown on the Victoza demonstration pen  For better compliance he can use Invokamet instead of Invokana and metformin separately Discussed that this is the same medication in combination and can be taken in the morning.  Patient brochure given and discussed compliance with this  Given a new glucose monitor and discussed checking blood sugars at various times Increase exercise Consultation with the dietitian may be needed Consistent diet Discussed importance of good control and A1c target of 7%    Patient Instructions  Check blood sugars on waking up  2x per week  Also check blood sugars about 2 hours after a meal and do this after different meals by rotation  Recommended blood sugar levels on waking up is 90-130 and about 2 hours  after meal is 130-160  Please bring your blood sugar monitor to each visit, thank you  Start VICTOZA injection as shown once daily at the same time of the day.   Dial the dose to 0.6 mg on the pen for the first 5 days then 5 more clicks  After 1 week increase the dose to 1.2mg  daily if no nausea present.    If any questions or concerns are present call the office or the [facility] helpline at [phone]. Visit [email or web address] for more useful information  Exercise 5x per week    Counseling time on subjects discussed above is over 50% of today's 25 minute visit   Koal Eslinger 03/27/2016, 5:06 PM

## 2016-03-27 NOTE — Patient Instructions (Signed)
Check blood sugars on waking up  2x per week  Also check blood sugars about 2 hours after a meal and do this after different meals by rotation  Recommended blood sugar levels on waking up is 90-130 and about 2 hours after meal is 130-160  Please bring your blood sugar monitor to each visit, thank you  Start VICTOZA injection as shown once daily at the same time of the day.   Dial the dose to 0.6 mg on the pen for the first 5 days then 5 more clicks  After 1 week increase the dose to 1.2mg  daily if no nausea present.    If any questions or concerns are present call the office or the [facility] helpline at [phone]. Visit [email or web address] for more useful information  Exercise 5x per week

## 2016-03-28 ENCOUNTER — Ambulatory Visit: Payer: BLUE CROSS/BLUE SHIELD

## 2016-03-29 ENCOUNTER — Other Ambulatory Visit: Payer: Self-pay | Admitting: Family Medicine

## 2016-05-08 ENCOUNTER — Other Ambulatory Visit: Payer: Self-pay | Admitting: Internal Medicine

## 2016-05-22 ENCOUNTER — Other Ambulatory Visit (INDEPENDENT_AMBULATORY_CARE_PROVIDER_SITE_OTHER): Payer: Self-pay

## 2016-05-22 ENCOUNTER — Encounter: Payer: Self-pay | Admitting: Endocrinology

## 2016-05-22 DIAGNOSIS — E1165 Type 2 diabetes mellitus with hyperglycemia: Secondary | ICD-10-CM

## 2016-05-22 LAB — BASIC METABOLIC PANEL
BUN: 13 mg/dL (ref 6–23)
CHLORIDE: 104 meq/L (ref 96–112)
CO2: 24 mEq/L (ref 19–32)
Calcium: 9.2 mg/dL (ref 8.4–10.5)
Creatinine, Ser: 0.73 mg/dL (ref 0.40–1.20)
GFR: 108.08 mL/min (ref >60.00)
Glucose, Bld: 177 mg/dL — ABNORMAL HIGH (ref 70–99)
POTASSIUM: 3.7 meq/L (ref 3.5–5.1)
SODIUM: 139 meq/L (ref 135–145)

## 2016-05-23 LAB — FRUCTOSAMINE: FRUCTOSAMINE: 290 umol/L — AB (ref 0–285)

## 2016-05-27 ENCOUNTER — Ambulatory Visit (INDEPENDENT_AMBULATORY_CARE_PROVIDER_SITE_OTHER): Payer: 59 | Admitting: Endocrinology

## 2016-05-27 ENCOUNTER — Encounter: Payer: Self-pay | Admitting: Endocrinology

## 2016-05-27 VITALS — BP 130/82 | HR 63 | Ht 65.0 in | Wt 221.0 lb

## 2016-05-27 DIAGNOSIS — E1165 Type 2 diabetes mellitus with hyperglycemia: Secondary | ICD-10-CM

## 2016-05-27 NOTE — Patient Instructions (Addendum)
Glimeperide 1 pill twice daily  Check blood sugars on waking up    Also check blood sugars about 2 hours after a meal and do this after different meals by rotation  Recommended blood sugar levels on waking up is 90-130 and about 2 hours after meal is 130-160  Please bring your blood sugar monitor to each visit, thank you

## 2016-05-27 NOTE — Progress Notes (Signed)
Patient ID: Carla Williams, female   DOB: 05-08-1966, 50 y.o.   MRN: [id number]    Reason for Appointment : Follow-up for Type 2 Diabetes  History of Present Illness          Diagnosis: Type 2 diabetes mellitus, date of diagnosis: 2007      Past history: She was initially told to have mild diabetes and advised to lose weight with diet and exercise. About a year later because of higher blood sugars she was started on metformin low dose and this was increased. However because of lack of adequate control she was given Amaryl in addition soon after She thinks her blood sugars and overall were subsequently controlled with A1c as low as around 6% but records are not available In 2014 blood sugars apparently started increasing significantly. She was initially given Januvia but subsequently in 11/14 she still had a high A1c She has taken Invokana since 11/14 which was started because of a high A1c of 9.1%  Recent history:   She was started on Victoza in 12/15 because of persistently high A1c of 8%. She previously had significant improvement in her A1c with this   Her last A1c in 9/17 was 8.1 Fructosamine is still high at 290  Current management, blood sugar patterns and problems  She was getting only one tablet of Amaryl in October instead of 2 tablets   However not taking both of them at midday  Blood sugars appear to be periodically higher after evening meal and also her lab fasting glucose was 177  On her last visit she was not taking Victoza and now she is tolerating 1.2 mg  She has not been consistent with her diet, has gained a couple of pounds despite restarting Victoza  Not motivated to exercise        Oral hypoglycemic drugs the patient is taking are: Invokamet 150/1000 mg, Amaryl 4 mg qd        Side effects from medications have been: None   Glucose monitoring:  done only 8 times in the last month        Glucometer: One Touch Verio .       Blood Glucose  readings    Mean values apply above for all meters except median for One Touch  PRE-MEAL Fasting Lunch Dinner Bedtime Overall  Glucose range: 168       Mean/median:     163    POST-MEAL PC Breakfast PC Lunch PC Dinner  Glucose range: 188   131-276   Mean/median:   162     Glycemic control:   Lab Results  Component Value Date   HGBA1C 8.1 03/27/2016   HGBA1C 8.8 (H) 07/17/2015   HGBA1C 7.0 (H) 01/19/2015   Lab Results  Component Value Date   MICROALBUR <0.7 07/17/2015   LDLCALC 89 03/22/2016   CREATININE 0.73 05/22/2016    Self-care: The diet that the patient has been following is: tries to limit carbohydrate and fruits .      Meals: 2-3 meals per day. No breakfast; Lunch may be larger.            Exercise: is doing walking 0-1/7 Days a week       Dietician visit:  05/2014              Weight history:  Wt Readings from Last 3 Encounters:  05/27/16 221 lb (100.2 kg)  03/27/16 219 lb (99.3 kg)  12/01/15 221 lb (100.2 kg)  Lab on 05/22/2016  Component Date Value Ref Range Status  . Sodium 05/22/2016 139  135 - 145 mEq/L Final  . Potassium 05/22/2016 3.7  3.5 - 5.1 mEq/L Final  . Chloride 05/22/2016 104  96 - 112 mEq/L Final  . CO2 05/22/2016 24  19 - 32 mEq/L Final  . Glucose, Bld 05/22/2016 177* 70 - 99 mg/dL Final  . BUN 05/22/2016 13  6 - 23 mg/dL Final  . Creatinine, Ser 05/22/2016 0.73  0.40 - 1.20 mg/dL Final  . Calcium 05/22/2016 9.2  8.4 - 10.5 mg/dL Final  . GFR 05/22/2016 108.08  >60.00 mL/min Final  . Fructosamine 05/23/2016 290* 0 - 285 umol/L Final   Comment: Published reference interval for apparently healthy subjects between age 25 and 62 is 67 - 285 umol/L and in a poorly controlled diabetic population is 228 - 563 umol/L with a mean of 396 umol/L.        Medication List       Accurate as of 05/27/16 11:09 AM. Always use your most recent med list.          atorvastatin 40 MG tablet Commonly known as:  LIPITOR TAKE 1 TABLET (40 MG  TOTAL) BY MOUTH DAILY. ONE TABLET BY MOUTH ONCE DAILY   benazepril-hydrochlorthiazide 10-12.5 MG tablet Commonly known as:  LOTENSIN HCT TAKE 1 TABLET BY MOUTH DAILY.   Canagliflozin-Metformin HCl ER [phone] MG Tb24 Commonly known as:  INVOKAMET XR Take 150-1,000 mg by mouth daily. Take 2 tablets daily   esomeprazole 40 MG capsule Commonly known as:  NEXIUM TAKE 1 CAPSULE (40 MG TOTAL) BY MOUTH DAILY BEFORE BREAKFAST. AS NEEDED   glimepiride 2 MG tablet Commonly known as:  AMARYL TAKE 1 TABLET (2 MG TOTAL) BY MOUTH 2 (TWO) TIMES DAILY.   ibuprofen 800 MG tablet Commonly known as:  ADVIL,MOTRIN TAKE 1 TABLET (800 MG TOTAL) BY MOUTH AS NEEDED.   Insulin Pen Needle 32G X 5 MM Misc Commonly known as:  NOVOTWIST Use one per day to inject Victoza   liraglutide 18 MG/3ML Sopn Commonly known as:  VICTOZA Inject 1.2 mg daily   LORazepam 0.5 MG tablet Commonly known as:  ATIVAN Take 1 tablet (0.5 mg total) by mouth 2 (two) times daily as needed for anxiety.   ONETOUCH DELICA LANCETS 99991111 Misc Use to check blood sugar 2 times per day dx code E11.9   ONETOUCH VERIO test strip Generic drug:  glucose blood USE AS INSTRUCTED TO CHECK BLOOD SUGAR 2 TIMES PER DAY DX CODE E11.9       Allergies: No Known Allergies  Past Medical History:  Diagnosis Date  . Colon polyps    hyperplastic  . Diabetes mellitus   . GERD (gastroesophageal reflux disease)   . Hyperlipemia   . Hypertension   . Obesity     Past Surgical History:  Procedure Laterality Date  . TUBAL LIGATION      Family History  Problem Relation Age of Onset  . Colon cancer Neg Hx   . Stomach cancer Neg Hx   . Esophageal cancer Neg Hx   . Breast cancer      Maternal Great Aunt   . Diabetes Father     and Mother  . Heart disease Father     Social History:  reports that she has never smoked. She has never used smokeless tobacco. She reports that she does not drink alcohol or use drugs.    Review of Systems  Lipids: She has been on Lipitor from her PCP  She thinks she is taking this regularly with results as follows:  Lab Results  Component Value Date   CHOL 180 03/22/2016   CHOL 209 (H) 07/17/2015   CHOL 178 07/12/2014   Lab Results  Component Value Date   HDL 53.80 03/22/2016   HDL 54.80 07/17/2015   HDL 49.60 07/12/2014   Lab Results  Component Value Date   LDLCALC 89 03/22/2016   LDLCALC 131 (H) 07/17/2015   LDLCALC 114 (H) 07/12/2014   Lab Results  Component Value Date   TRIG 186.0 (H) 03/22/2016   TRIG 116.0 07/17/2015   TRIG 74.0 07/12/2014   Lab Results  Component Value Date   CHOLHDL 3 03/22/2016   CHOLHDL 4 07/17/2015   CHOLHDL 4 07/12/2014   Lab Results  Component Value Date   LDLDIRECT 100.0 09/29/2014                 Lab Results  Component Value Date   ALT 33 07/17/2015       The blood pressure has been controlled with Lotensin HCT        Physical Examination:  BP 130/82   Pulse 63   Ht 5\' 5"  (1.651 m)   Wt 221 lb (100.2 kg)   LMP 03/15/2013   BMI 36.78 kg/m    Diabetes type 2, uncontrolled     See history of present illness for detailed discussion of current diabetes management, blood sugar patterns and problems identified  Her sugars did not appear to be optimally controlled still, fructosamine 290 She has several high readings after supper and most likely is not consistent on diet Has not checked fasting readings in the last month but since lab glucose was 177 she likely has hyperglycemia overnight Discussed with the patient that she likely is developing insulin deficiency because of inadequate control with multiple drugs including Invokana and Victoza She does need to do better with exercise Also will need to review of her meal planning with dietitian  PLAN:  No change in medication regimen as yet Discussed possibility of adding basal insulin Meanwhile will change the Amaryl to 2 mg twice a day instead of 4 mg at  lunch Increase exercise, she will try doing exercise class at work that is being planned Consultation with the dietitian to be scheduled More consistent glucose monitoring at various times including fasting Call if blood sugars are not improving Consistent diet Discussed importance of good control and A1c target of 7%    Patient Instructions  Glimeperide 1 pill twice daily  Check blood sugars on waking up    Also check blood sugars about 2 hours after a meal and do this after different meals by rotation  Recommended blood sugar levels on waking up is 90-130 and about 2 hours after meal is 130-160  Please bring your blood sugar monitor to each visit, thank you        [facility] 05/27/2016, 11:09 AM

## 2016-06-19 ENCOUNTER — Other Ambulatory Visit: Payer: Self-pay | Admitting: Endocrinology

## 2016-06-19 DIAGNOSIS — E1165 Type 2 diabetes mellitus with hyperglycemia: Secondary | ICD-10-CM

## 2016-06-21 ENCOUNTER — Encounter: Payer: 59 | Attending: Endocrinology | Admitting: Dietician

## 2016-06-21 ENCOUNTER — Encounter: Payer: Self-pay | Admitting: Dietician

## 2016-06-21 DIAGNOSIS — Z713 Dietary counseling and surveillance: Secondary | ICD-10-CM | POA: Diagnosis not present

## 2016-06-21 DIAGNOSIS — E1165 Type 2 diabetes mellitus with hyperglycemia: Secondary | ICD-10-CM | POA: Insufficient documentation

## 2016-06-21 DIAGNOSIS — E119 Type 2 diabetes mellitus without complications: Secondary | ICD-10-CM

## 2016-06-21 NOTE — Patient Instructions (Signed)
Be mindful about food choices. Eat slowly and stop when you are satisfied. Compare food choices when eating out Bake, broiled, grilled more often than fried. Consider an alarm to remember to take your Victoza.  Plan:  Aim for 3 Carb Choices per meal (45 grams) +/- 1 either way  Aim for 0-1 Carbs per snack if hungry  Include protein in moderation with your meals and snacks Consider reading food labels for Total Carbohydrate and Fat Grams of foods Consider  increasing your activity level by Zumba or walking for 30-60 minutes daily as tolerated Consider checking BG at alternate times per day as directed by MD

## 2016-06-21 NOTE — Progress Notes (Signed)
Medical Nutrition Therapy:  Appt start time: [id number] end time:  [id number].   Assessment:  Primary concerns today: Patient is here alone.  She would like learn to control her blood sugar better and learn about food to clear up food fallacies that she has heard.  She has had type 2 diabetes since 2007 and is a patient of Dr. Dwyane Dee.  Other hx includes GERD, HTN, and hyperlipidemia.  Her last A1C was 8.1% 03/27/16. And Fructosamine of 290 05/22/16.  Weight hx: 223 lbs today 135 lbs lowest adult weight 252 highest adult weight  Patient lives with her husband.  Neither cook at home much on week days.  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  Preferred Learning Style:   No preference indicated   Learning Readiness:   Ready  MEDICATIONS: see list to include Invokamet R, glimipuride, Victoza.  She forgets to take the Victoza at times.   DIETARY INTAKE:  Usual eating pattern includes 2 meals and 2 snacks per day. Everyday foods include eating out.    24-hr recall:  B ( AM): none Snk (9:30-10 AM): raw veges, cheese, nuts and clementine OR Honey nut cheerios and milk L (11:30-2 PM): out to eat ("kid's meals"- BBQ sandwich, fries OR chicken pie OR 2 piece chicken, rice, green beans OR wings and chips OR vegetables and chicken OR grilled chicken salad OR chicken nuggets and fries OR Chipotle OR seafood, baked potato) OR skips (popcorn, NABS) Snk ( PM): none or apple or clementine OR spaghetti OR chicken Alfredo OR pizza on weekens D ( PM): leftovers OR frozen veges, cheese OR soup OR cereal OR baked potato with butter, sour cream and rotisserie chicken salad Snk ( PM): rare ice cream and potato chips Beverages: Water, small bottle of regular coke daily, flavored water, occasiona [place]  Usual physical activity:  Zumba and walking in the past and needs to  resume.  Estimated energy needs: 1600 calories 180 g carbohydrates 100 g protein 53 g fat  Progress Towards Goal(s):  In progress.   Nutritional Diagnosis:  NB-1.1 Food and nutrition-related knowledge deficit As related to balance of carbohydrate, proteins, and fats.  As evidenced by diet hx and patient report.    Intervention:  Nutrition counseling and diabetes education initiated. Discussed Carb Counting by food group as method of portion control, reading food labels, and benefits of increased activity. Also discussed basic physiology of Diabetes, target BG ranges pre and post meals, and A1c.   Be mindful about food choices. Eat slowly and stop when you are satisfied. Compare food choices when eating out Bake, broiled, grilled more often than fried. Consider an alarm to remember to take your Victoza.  Plan:  Aim for 3 Carb Choices per meal (45 grams) +/- 1 either way  Aim for 0-1 Carbs per snack if hungry  Include protein in moderation with your meals and snacks Consider reading food labels for Total Carbohydrate and Fat Grams of foods Consider  increasing your activity level by Zumba or walking for 30-60 minutes daily as tolerated Consider checking BG at alternate times per day as directed by MD   Teaching Method Utilized:  Visual Auditory Hands on  Handouts given during visit include: Living Well with Diabetes Food Label handouts Meal Plan Card  Label reading  Snack list  Making Healthy Fast Food Choices  Dining out with Diabetes  Diabetes app sheet for iphone  Barriers to learning/adherence to lifestyle change: time/energy  Demonstrated degree of understanding via:  Teach Back   Monitoring/Evaluation:  Dietary intake, exercise, label reading, and body weight prn.

## 2016-08-07 ENCOUNTER — Ambulatory Visit (INDEPENDENT_AMBULATORY_CARE_PROVIDER_SITE_OTHER): Payer: 59 | Admitting: Family Medicine

## 2016-08-07 VITALS — BP 130/84 | HR 95 | Temp 98.9°F | Ht 65.0 in | Wt 218.2 lb

## 2016-08-07 DIAGNOSIS — M791 Myalgia, unspecified site: Secondary | ICD-10-CM

## 2016-08-07 DIAGNOSIS — R6889 Other general symptoms and signs: Secondary | ICD-10-CM

## 2016-08-07 LAB — POCT INFLUENZA A/B
Influenza A, POC: NEGATIVE
Influenza B, POC: NEGATIVE

## 2016-08-07 MED ORDER — OSELTAMIVIR PHOSPHATE 75 MG PO CAPS: 75.0000 mg | ORAL_CAPSULE | Freq: Two times a day (BID) | ORAL | 0 refills | Status: DC

## 2016-08-07 NOTE — Progress Notes (Signed)
Subjective:     Patient ID: Carla Williams, female   DOB: [birth year], 51 y.o.   MRN: [id number]  HPI Patient seen with fairly acute onset 2 days ago of cough, muscle aches, headaches, chills, sore throat, and fever. She had some mild nausea but no vomiting. No diarrhea. Husband had similar symptoms last week.  Past Medical History:  Diagnosis Date  . Colon polyps    hyperplastic  . Diabetes mellitus   . GERD (gastroesophageal reflux disease)   . Hyperlipemia   . Hypertension   . Obesity    Past Surgical History:  Procedure Laterality Date  . TUBAL LIGATION      reports that she has never smoked. She has never used smokeless tobacco. She reports that she does not drink alcohol or use drugs. family history includes Diabetes in her father; Heart disease in her father. No Known Allergies   Review of Systems  Constitutional: Positive for chills, fatigue and fever.  HENT: Positive for congestion and sore throat.   Respiratory: Positive for cough. Negative for wheezing.   Neurological: Positive for headaches.       Objective:   Physical Exam  Constitutional: She appears well-developed and well-nourished.  HENT:  Right Ear: External ear normal.  Left Ear: External ear normal.  Mouth/Throat: Oropharynx is clear and moist.  Neck: Neck supple.  Cardiovascular: Normal rate and regular rhythm.   Pulmonary/Chest: Effort normal and breath sounds normal. No respiratory distress. She has no wheezes. She has no rales.  Lymphadenopathy:    She has no cervical adenopathy.       Assessment:     Febrile illness. Suspect influenza    Plan:     -Tamiflu 75 mg twice a day for 5 days -Stay well-hydrated -Tylenol or Advil as needed for fever and body aches -Follow-up for any persistent or worsening symptoms  Eulas Post MD [facility]

## 2016-08-07 NOTE — Patient Instructions (Signed)

## 2016-08-23 ENCOUNTER — Other Ambulatory Visit: Payer: 59

## 2016-08-27 ENCOUNTER — Ambulatory Visit: Payer: 59 | Admitting: Endocrinology

## 2016-09-02 ENCOUNTER — Other Ambulatory Visit: Payer: Self-pay | Admitting: Internal Medicine

## 2016-09-02 ENCOUNTER — Other Ambulatory Visit: Payer: Self-pay | Admitting: Endocrinology

## 2016-09-07 ENCOUNTER — Ambulatory Visit (HOSPITAL_COMMUNITY)
Admission: EM | Admit: 2016-09-07 | Discharge: 2016-09-07 | Disposition: A | Discharge status: Home / Self Care | Payer: 59 | Attending: Internal Medicine | Admitting: Internal Medicine

## 2016-09-07 ENCOUNTER — Encounter (HOSPITAL_COMMUNITY): Payer: Self-pay | Admitting: *Deleted

## 2016-09-07 ENCOUNTER — Ambulatory Visit (INDEPENDENT_AMBULATORY_CARE_PROVIDER_SITE_OTHER): Payer: 59

## 2016-09-07 DIAGNOSIS — L03011 Cellulitis of right finger: Secondary | ICD-10-CM | POA: Diagnosis not present

## 2016-09-07 DIAGNOSIS — S61210S Laceration without foreign body of right index finger without damage to nail, sequela: Secondary | ICD-10-CM | POA: Diagnosis not present

## 2016-09-07 DIAGNOSIS — Z23 Encounter for immunization: Secondary | ICD-10-CM

## 2016-09-07 MED ORDER — TETANUS-DIPHTH-ACELL PERTUSSIS 5-2.5-18.5 LF-MCG/0.5 IM SUSP
0.5000 mL | Freq: Once | INTRAMUSCULAR | Status: AC
  Administered 2016-09-07: 0.5 mL via INTRAMUSCULAR

## 2016-09-07 MED ORDER — TETANUS-DIPHTH-ACELL PERTUSSIS 5-2.5-18.5 LF-MCG/0.5 IM SUSP
INTRAMUSCULAR | Status: AC
  Filled 2016-09-07: qty 0.5

## 2016-09-07 MED ORDER — SULFAMETHOXAZOLE-TRIMETHOPRIM 800-160 MG PO TABS: 1.0000 | ORAL_TABLET | Freq: Two times a day (BID) | ORAL | 0 refills | Status: AC

## 2016-09-07 NOTE — ED Provider Notes (Signed)
CSN: [id number]     Arrival date & time 09/07/16  1302 History   First MD Initiated Contact with Patient 09/07/16 1409     Chief Complaint  Patient presents with  . Finger Injury   (Consider location/radiation/quality/duration/timing/severity/associated sxs/prior Treatment) Patient c/o laceration to right index finger 5 days ago and now has swelling in right index fingertip.   The history is provided by the patient.  Hand Pain  This is a new problem. The current episode started more than 2 days ago. The problem occurs constantly. The problem has not changed since onset.Nothing aggravates the symptoms. Nothing relieves the symptoms.    Past Medical History:  Diagnosis Date  . Colon polyps    hyperplastic  . Diabetes mellitus   . GERD (gastroesophageal reflux disease)   . Hyperlipemia   . Hypertension   . Obesity    Past Surgical History:  Procedure Laterality Date  . TUBAL LIGATION     Family History  Problem Relation Age of Onset  . Breast cancer      Maternal Great Aunt   . Diabetes Father     and Mother  . Heart disease Father   . Colon cancer Neg Hx   . Stomach cancer Neg Hx   . Esophageal cancer Neg Hx    Social History  Substance Use Topics  . Smoking status: Never Smoker  . Smokeless tobacco: Never Used  . Alcohol use No   OB History    No data available     Review of Systems  Constitutional: Negative.   HENT: Negative.   Eyes: Negative.   Respiratory: Negative.   Cardiovascular: Negative.   Gastrointestinal: Negative.   Endocrine: Negative.   Genitourinary: Negative.   Musculoskeletal: Negative.   Skin: Positive for wound.  Allergic/Immunologic: Negative.   Neurological: Negative.   Hematological: Negative.   Psychiatric/Behavioral: Negative.     Allergies  Patient has no known allergies.  Home Medications   Prior to Admission medications   Medication Sig Start Date End Date Taking? Authorizing Provider  atorvastatin (LIPITOR) 40 MG  tablet TAKE 1 TABLET (40 MG TOTAL) BY MOUTH DAILY. ONE TABLET BY MOUTH ONCE DAILY 05/08/16  Yes Marletta Lor, MD  benazepril-hydrochlorthiazide (LOTENSIN HCT) 10-12.5 MG tablet TAKE 1 TABLET BY MOUTH DAILY. 09/02/16  Yes Marletta Lor, MD  esomeprazole (NEXIUM) 40 MG capsule TAKE 1 CAPSULE (40 MG TOTAL) BY MOUTH DAILY BEFORE BREAKFAST. AS NEEDED 10/12/15  Yes Marletta Lor, MD  glimepiride (AMARYL) 2 MG tablet TAKE 1 TABLET (2 MG TOTAL) BY MOUTH 2 (TWO) TIMES DAILY. 01/01/16  Yes Elayne Snare, MD  ibuprofen (ADVIL,MOTRIN) 800 MG tablet TAKE 1 TABLET (800 MG TOTAL) BY MOUTH AS NEEDED. 04/01/16  Yes Laurey Morale, MD  Insulin Pen Needle (NOVOTWIST) 32G X 5 MM MISC Use one per day to inject Victoza 07/08/14  Yes Elayne Snare, MD  INVOKAMET XR [phone] MG TB24 TAKE 2 TABLETS DAILY 09/02/16  Yes Elayne Snare, MD  Liraglutide (VICTOZA) 18 MG/3ML SOPN Inject 1.2 mg daily 08/23/15  Yes Elayne Snare, MD  [place] DELICA LANCETS 03J MISC Use to check blood sugar 2 times per day dx code E11.9 08/29/15  Yes Elayne Snare, MD  ONETOUCH VERIO test strip USE AS INSTRUCTED TO CHECK BLOOD SUGAR 2 TIMES PER DAY DX CODE E11.9 08/28/15  Yes Elayne Snare, MD  LORazepam (ATIVAN) 0.5 MG tablet Take 1 tablet (0.5 mg total) by mouth 2 (two) times daily as needed for anxiety.  07/26/14   Marletta Lor, MD  oseltamivir (TAMIFLU) 75 MG capsule Take 1 capsule (75 mg total) by mouth 2 (two) times daily. 08/07/16   Eulas Post, MD  sulfamethoxazole-trimethoprim (BACTRIM DS,SEPTRA DS) 800-160 MG tablet Take 1 tablet by mouth 2 (two) times daily. 09/07/16 09/14/16  Lysbeth Penner, FNP   Meds Ordered and Administered this Visit   Medications  Tdap (BOOSTRIX) injection 0.5 mL (0.5 mLs Intramuscular Given 09/07/16 1522)    BP 132/77   Pulse 72   Temp 97.6 F (36.4 C) (Oral)   Resp 16   LMP 03/15/2013   SpO2 100%  No data found.   Physical Exam  Constitutional: She appears well-developed and well-nourished.  HENT:  Head:  Normocephalic and atraumatic.  Eyes: Conjunctivae and EOM are normal. Pupils are equal, round, and reactive to light.  Neck: Normal range of motion. Neck supple.  Cardiovascular: Normal rate, regular rhythm and normal heart sounds.   Pulmonary/Chest: Effort normal.  Abdominal: Soft. Bowel sounds are normal.  Skin:  Right index finger with laceration approx 1/2 cm with tenderness and swelling around site.  No drainage.  Nursing note and vitals reviewed.   Urgent Care Course     Procedures (including critical care time)  Labs Review Labs Reviewed - No data to display  Imaging Review Dg Finger Index Right  Result Date: 09/07/2016 CLINICAL DATA:  Trauma of the second finger.  Pain.  Swelling. EXAM: RIGHT INDEX FINGER 2+V COMPARISON:  None. FINDINGS: There is no evidence of fracture or dislocation. There is no evidence of arthropathy or other focal bone abnormality. Soft tissues are unremarkable. IMPRESSION: No acute osseous injury of the right second digit. Electronically Signed   By: Kathreen Devoid   On: 09/07/2016 15:08     Visual Acuity Review  Right Eye Distance:   Left Eye Distance:   Bilateral Distance:    Right Eye Near:   Left Eye Near:    Bilateral Near:         MDM   1. Laceration of right index finger without damage to nail, foreign body presence unspecified, sequela   2. Cellulitis of finger of right hand    Bactrim DS one po bid x 7 days TDAP      Lysbeth Penner, FNP 09/07/16 1547

## 2016-09-07 NOTE — ED Triage Notes (Signed)
C/o shutting right index finger in truck door 5 days ago.  Has small scabbed laceration to pad of index finger.  C/O continued swelling and difficulty bending finger.  Has been applying H2O2 and bandage.

## 2016-11-02 ENCOUNTER — Other Ambulatory Visit: Payer: Self-pay | Admitting: Internal Medicine

## 2016-11-18 ENCOUNTER — Other Ambulatory Visit: Payer: 59

## 2016-11-21 ENCOUNTER — Ambulatory Visit: Payer: 59 | Admitting: Endocrinology

## 2016-11-29 ENCOUNTER — Other Ambulatory Visit: Payer: Self-pay | Admitting: Endocrinology

## 2016-12-13 ENCOUNTER — Encounter: Payer: Self-pay | Admitting: Internal Medicine

## 2016-12-13 ENCOUNTER — Ambulatory Visit (INDEPENDENT_AMBULATORY_CARE_PROVIDER_SITE_OTHER): Payer: 59 | Admitting: Internal Medicine

## 2016-12-13 VITALS — BP 124/82 | HR 78 | Temp 98.1°F | Ht 65.0 in | Wt 203.2 lb

## 2016-12-13 DIAGNOSIS — Z0000 Encounter for general adult medical examination without abnormal findings: Secondary | ICD-10-CM | POA: Diagnosis not present

## 2016-12-13 DIAGNOSIS — R7309 Other abnormal glucose: Secondary | ICD-10-CM | POA: Diagnosis not present

## 2016-12-13 LAB — COMPREHENSIVE METABOLIC PANEL
ALK PHOS: 66 U/L (ref 39–117)
ALT: 50 U/L — AB (ref 0–35)
AST: 39 U/L — AB (ref 0–37)
Albumin: 4.6 g/dL (ref 3.5–5.2)
BILIRUBIN TOTAL: 0.9 mg/dL (ref 0.2–1.2)
BUN: 11 mg/dL (ref 6–23)
CALCIUM: 9.6 mg/dL (ref 8.4–10.5)
CO2: 28 meq/L (ref 19–32)
CREATININE: 0.67 mg/dL (ref 0.40–1.20)
Chloride: 102 mEq/L (ref 96–112)
GFR: 119.06 mL/min (ref >60.00)
Glucose, Bld: 339 mg/dL — ABNORMAL HIGH (ref 70–99)
Potassium: 4.3 mEq/L (ref 3.5–5.1)
Sodium: 139 mEq/L (ref 135–145)
TOTAL PROTEIN: 7.1 g/dL (ref 6.0–8.3)

## 2016-12-13 LAB — CBC WITH DIFFERENTIAL/PLATELET
BASOS PCT: 0.6 % (ref 0.0–3.0)
Basophils Absolute: 0 10*3/uL (ref 0.0–0.1)
Eosinophils Absolute: 0 10*3/uL (ref 0.0–0.7)
Eosinophils Relative: 1.4 % (ref 0.0–5.0)
HEMATOCRIT: 43.3 % (ref 36.0–46.0)
Hemoglobin: 14.7 g/dL (ref 12.0–15.0)
LYMPHS ABS: 1.7 10*3/uL (ref 0.7–4.0)
LYMPHS PCT: 48.8 % — AB (ref 12.0–46.0)
MCHC: 33.9 g/dL (ref 30.0–36.0)
MCV: 89.6 fl (ref 78.0–100.0)
MONOS PCT: 6.8 % (ref 3.0–12.0)
Monocytes Absolute: 0.2 10*3/uL (ref 0.1–1.0)
NEUTROS ABS: 1.5 10*3/uL (ref 1.4–7.7)
NEUTROS PCT: 42.4 % — AB (ref 43.0–77.0)
PLATELETS: 235 10*3/uL (ref 150.0–400.0)
RBC: 4.84 Mil/uL (ref 3.87–5.11)
RDW: 12.8 % (ref 11.5–15.5)
WBC: 3.5 10*3/uL — ABNORMAL LOW (ref 4.0–10.5)

## 2016-12-13 LAB — MICROALBUMIN / CREATININE URINE RATIO
Creatinine,U: 69.2 mg/dL
MICROALB UR: 0.8 mg/dL (ref 0.0–1.9)
Microalb Creat Ratio: 1.2 mg/g (ref 0.0–30.0)

## 2016-12-13 LAB — TSH: TSH: 0.93 u[IU]/mL (ref 0.35–4.50)

## 2016-12-13 LAB — HEMOGLOBIN A1C: Hgb A1c MFr Bld: 12.7 % — ABNORMAL HIGH (ref 4.6–6.5)

## 2016-12-13 NOTE — Progress Notes (Signed)
Subjective:    Patient ID: Carla Williams, female    DOB: Sep 27, 1965, 51 y.o.   MRN: [id number]  HPI 51 year old patient who is seen today for a wellness visit  Lab Results  Component Value Date   HGBA1C 8.1 03/27/2016   She is followed by endocrinology with type 2 diabetes.  Medical regimen includes GLP-1 agonist, which she does not take daily Last hemoglobin A1c not well controlled in spite of some successful weight loss  Wt Readings from Last 3 Encounters:  12/13/16 203 lb 3.2 oz (92.2 kg)  08/07/16 218 lb 3.2 oz (99 kg)  06/21/16 223 lb (101.2 kg)   She wishes to transfer to the care of Dr. Chalmers Williams, endocrinologist of [facility]  She did have a colonoscopy in February 2017 and a mammogram in September 2017.  She is scheduled for a gynecologic examination later this month An annual eye examination encouraged  Past Medical History:  Diagnosis Date  . Colon polyps    hyperplastic  . Diabetes mellitus   . GERD (gastroesophageal reflux disease)   . Hyperlipemia   . Hypertension   . Obesity      Social History   Social History  . Marital status: Married    Spouse name: N/A  . Number of children: 2  . Years of education: N/A   Occupational History  . Social Worker    Social History Main Topics  . Smoking status: Never Smoker  . Smokeless tobacco: Never Used  . Alcohol use No  . Drug use: No  . Sexual activity: Not on file   Other Topics Concern  . Not on file   Social History Narrative   One caffeine drink daily     Past Surgical History:  Procedure Laterality Date  . TUBAL LIGATION      Family History  Problem Relation Age of Onset  . Breast cancer Unknown        Maternal Great Aunt   . Diabetes Father        and Mother  . Heart disease Father   . Colon cancer Neg Hx   . Stomach cancer Neg Hx   . Esophageal cancer Neg Hx     No Known Allergies  Current Outpatient Prescriptions on File Prior to Visit  Medication Sig  Dispense Refill  . atorvastatin (LIPITOR) 40 MG tablet TAKE 1 TABLET (40 MG TOTAL) BY MOUTH DAILY. ONE TABLET BY MOUTH ONCE DAILY 90 tablet 1  . benazepril-hydrochlorthiazide (LOTENSIN HCT) 10-12.5 MG tablet TAKE 1 TABLET BY MOUTH DAILY. 30 tablet 0  . esomeprazole (NEXIUM) 40 MG capsule TAKE 1 CAPSULE (40 MG TOTAL) BY MOUTH DAILY BEFORE BREAKFAST. AS NEEDED 90 capsule 0  . glimepiride (AMARYL) 2 MG tablet TAKE 1 TABLET (2 MG TOTAL) BY MOUTH 2 (TWO) TIMES DAILY. 180 tablet 0  . ibuprofen (ADVIL,MOTRIN) 800 MG tablet TAKE 1 TABLET (800 MG TOTAL) BY MOUTH AS NEEDED. 60 tablet 1  . Insulin Pen Needle (NOVOTWIST) 32G X 5 MM MISC Use one per day to inject Victoza 50 each 3  . INVOKAMET XR [phone] MG TB24 TAKE 2 TABLETS DAILY 60 tablet 0  . Liraglutide (VICTOZA) 18 MG/3ML SOPN Inject 1.2 mg daily 2 pen 3  . LORazepam (ATIVAN) 0.5 MG tablet Take 1 tablet (0.5 mg total) by mouth 2 (two) times daily as needed for anxiety. 60 tablet 3  . ONETOUCH DELICA LANCETS 78G MISC Use to check blood sugar 2 times per day dx  code E11.9 100 each 3  . ONETOUCH VERIO test strip USE AS INSTRUCTED TO CHECK BLOOD SUGAR 2 TIMES PER DAY DX CODE E11.9 100 each 3   No current facility-administered medications on file prior to visit.     BP 124/82 (BP Location: Left Arm, Patient Position: Sitting, Cuff Size: Large)   Pulse 78   Temp 98.1 F (36.7 C) (Oral)   Ht 5\' 5"  (1.651 m)   Wt 203 lb 3.2 oz (92.2 kg)   LMP 03/15/2013   SpO2 97%   BMI 33.81 kg/m      Review of Systems  Constitutional: Negative.   HENT: Negative for congestion, dental problem, hearing loss, rhinorrhea, sinus pressure, sore throat and tinnitus.   Eyes: Negative for pain, discharge and visual disturbance.  Respiratory: Negative for cough and shortness of breath.   Cardiovascular: Negative for chest pain, palpitations and leg swelling.  Gastrointestinal: Negative for abdominal distention, abdominal pain, blood in stool, constipation, diarrhea,  nausea and vomiting.  Genitourinary: Negative for difficulty urinating, dysuria, flank pain, frequency, hematuria, pelvic pain, urgency, vaginal bleeding, vaginal discharge and vaginal pain.  Musculoskeletal: Negative for arthralgias, gait problem and joint swelling.  Skin: Negative for rash.  Neurological: Negative for dizziness, syncope, speech difficulty, weakness, numbness and headaches.  Hematological: Negative for adenopathy.  Psychiatric/Behavioral: Negative for agitation, behavioral problems and dysphoric mood. The patient is not nervous/anxious.        Objective:   Physical Exam  Constitutional: She is oriented to person, place, and time. She appears well-developed and well-nourished.  HENT:  Head: Normocephalic and atraumatic.  Right Ear: External ear normal.  Left Ear: External ear normal.  Mouth/Throat: Oropharynx is clear and moist.  Eyes: Conjunctivae and EOM are normal.  Neck: Normal range of motion. Neck supple. No JVD present. No thyromegaly present.  Cardiovascular: Normal rate, regular rhythm, normal heart sounds and intact distal pulses.   No murmur heard. Pulmonary/Chest: Effort normal and breath sounds normal. She has no wheezes. She has no rales.  Abdominal: Soft. Bowel sounds are normal. She exhibits no distension and no mass. There is no tenderness. There is no rebound and no guarding.  Musculoskeletal: Normal range of motion. She exhibits no edema or tenderness.  Neurological: She is alert and oriented to person, place, and time. She has normal reflexes. No cranial nerve deficit. She exhibits normal muscle tone. Coordination normal.  Skin: Skin is warm and dry. No rash noted.  Psychiatric: She has a normal mood and affect. Her behavior is normal.          Assessment & Plan:   Preventive health examination Diabetes mellitus.  Suboptimal control.  Per her request, will set up for endocrine referral Dyslipidemia.  Continue statin therapy Essential  hypertension, controlled GERD stable  Follow-up here in one year Annual eye examination encouraged  Carla Williams

## 2016-12-13 NOTE — Patient Instructions (Signed)
Endocrinology follow-up as discussed  Please see your eye doctor yearly to check for diabetic eye damage  You need to lose weight.  Consider a lower calorie diet and regular exercise.    It is important that you exercise regularly, at least 20 minutes 3 to 4 times per week.  If you develop chest pain or shortness of breath seek  medical attention.  Return in one year for follow-up

## 2016-12-15 ENCOUNTER — Other Ambulatory Visit: Payer: Self-pay | Admitting: Endocrinology

## 2016-12-16 ENCOUNTER — Telehealth: Payer: Self-pay | Admitting: Internal Medicine

## 2016-12-16 NOTE — Telephone Encounter (Signed)
Pt would like blood work results °

## 2016-12-17 ENCOUNTER — Telehealth: Payer: Self-pay | Admitting: Internal Medicine

## 2016-12-17 NOTE — Telephone Encounter (Signed)
Spoke with pt about lab results yesterday also have placed a copy of results in outgoing mail.

## 2016-12-17 NOTE — Telephone Encounter (Signed)
[facility] called and stated their Endo office cannot see the patient until August.  There are openings available on Carla Williams's schedule for late July.  Dr. Raliegh Ip wants patient to be seen in office ASAP.  Scheduling patient for visit with Dr. Raliegh Ip next week but patient still needs Endo appt ASAP.

## 2016-12-23 ENCOUNTER — Ambulatory Visit: Payer: 59 | Admitting: Internal Medicine

## 2016-12-25 ENCOUNTER — Ambulatory Visit (INDEPENDENT_AMBULATORY_CARE_PROVIDER_SITE_OTHER): Payer: 59 | Admitting: Internal Medicine

## 2016-12-25 ENCOUNTER — Encounter: Payer: Self-pay | Admitting: Internal Medicine

## 2016-12-25 VITALS — BP 112/64 | HR 77 | Temp 98.0°F | Ht 65.0 in | Wt 200.2 lb

## 2016-12-25 DIAGNOSIS — E785 Hyperlipidemia, unspecified: Secondary | ICD-10-CM

## 2016-12-25 DIAGNOSIS — E119 Type 2 diabetes mellitus without complications: Secondary | ICD-10-CM

## 2016-12-25 DIAGNOSIS — I10 Essential (primary) hypertension: Secondary | ICD-10-CM | POA: Diagnosis not present

## 2016-12-25 LAB — POCT GLUCOSE (DEVICE FOR HOME USE): POC Glucose: 209 mg/dl — AB (ref 70–99)

## 2016-12-25 MED ORDER — LIRAGLUTIDE 18 MG/3ML ~~LOC~~ SOPN: 0.6000 mg | PEN_INJECTOR | Freq: Every day | SUBCUTANEOUS | 3 refills | Status: DC

## 2016-12-25 MED ORDER — GLIMEPIRIDE 4 MG PO TABS: 4.0000 mg | ORAL_TABLET | Freq: Every day | ORAL | 3 refills | Status: DC

## 2016-12-25 NOTE — Patient Instructions (Addendum)
WE NOW OFFER   Carla Williams's FAST TRACK!!!  SAME DAY Appointments for ACUTE CARE  Such as: Sprains, Injuries, cuts, abrasions, rashes, muscle pain, joint pain, back pain Colds, flu, sore throats, headache, allergies, cough, fever  Ear pain, sinus and eye infections Abdominal pain, nausea, vomiting, diarrhea, upset stomach Animal/insect bites  3 Easy Ways to Schedule: Walk-In Scheduling Call in scheduling Mychart Sign-up: [email or web address]     Use saline irrigation, warm  moist compresses and over-the-counter decongestants only as directed.  Call if there is no improvement in 5 to 7 days, or sooner if you develop increasing pain, fever, or any new symptoms.  Endocrinology follow-up as scheduled

## 2016-12-25 NOTE — Addendum Note (Signed)
Addended by: Wyvonne Lenz on: 12/25/2016 10:18 AM   Modules accepted: Orders

## 2016-12-25 NOTE — Progress Notes (Signed)
Subjective:    Patient ID: Carla Williams, female    DOB: 01/10/1966, 51 y.o.   MRN: [id number]  HPI  Lab Results  Component Value Date   HGBA1C 12.7 (H) 12/13/2016    BP Readings from Last 3 Encounters:  12/25/16 112/64  12/13/16 124/82  09/07/16 132/77   Wt Readings from Last 3 Encounters:  12/25/16 200 lb 3.2 oz (90.8 kg)  12/13/16 203 lb 3.2 oz (92.2 kg)  08/07/16 218 lb 3.2 oz (87 kg)   51 year old patient who is seen today for follow-up of uncontrolled diabetes.  2 weeks ago, hemoglobin A1c was 12 point 7. She denies much in the way of hypoglycemic symptoms and only had nocturia times one last night.  She is scheduled to see endocrinology in August No home blood sugar monitoring. Blood sugar today 209. Yesterday she had the onset of some mild sore throat and sinus congestion.  No fever She has been on Victoza in the past, but this was down titrated due to nausea  Past Medical History:  Diagnosis Date  . Williams polyps    hyperplastic  . Diabetes mellitus   . GERD (gastroesophageal reflux disease)   . Hyperlipemia   . Hypertension   . Obesity      Social History   Social History  . Marital status: Married    Spouse name: N/A  . Number of children: 2  . Years of education: N/A   Occupational History  . Social Worker    Social History Main Topics  . Smoking status: Never Smoker  . Smokeless tobacco: Never Used  . Alcohol use No  . Drug use: No  . Sexual activity: Not on file   Other Topics Concern  . Not on file   Social History Narrative   One caffeine drink daily     Past Surgical History:  Procedure Laterality Date  . TUBAL LIGATION      Family History  Problem Relation Age of Onset  . Breast cancer Unknown        Maternal Great Aunt   . Diabetes Father        and Mother  . Heart disease Father   . Williams cancer Neg Hx   . Stomach cancer Neg Hx   . Esophageal cancer Neg Hx     No Known Allergies  Current Outpatient Prescriptions  on File Prior to Visit  Medication Sig Dispense Refill  . atorvastatin (LIPITOR) 40 MG tablet TAKE 1 TABLET (40 MG TOTAL) BY MOUTH DAILY. ONE TABLET BY MOUTH ONCE DAILY 90 tablet 1  . benazepril-hydrochlorthiazide (LOTENSIN HCT) 10-12.5 MG tablet TAKE 1 TABLET BY MOUTH DAILY. 30 tablet 0  . esomeprazole (NEXIUM) 40 MG capsule TAKE 1 CAPSULE (40 MG TOTAL) BY MOUTH DAILY BEFORE BREAKFAST. AS NEEDED 90 capsule 0  . glimepiride (AMARYL) 2 MG tablet TAKE 1 TABLET (2 MG TOTAL) BY MOUTH 2 (TWO) TIMES DAILY. 180 tablet 0  . ibuprofen (ADVIL,MOTRIN) 800 MG tablet TAKE 1 TABLET (800 MG TOTAL) BY MOUTH AS NEEDED. 60 tablet 1  . Insulin Pen Needle (NOVOTWIST) 32G X 5 MM MISC Use one per day to inject Victoza 50 each 3  . INVOKAMET XR [phone] MG TB24 TAKE 2 TABLETS DAILY 60 tablet 0  . Liraglutide (VICTOZA) 18 MG/3ML SOPN Inject 1.2 mg daily 2 pen 3  . LORazepam (ATIVAN) 0.5 MG tablet Take 1 tablet (0.5 mg total) by mouth 2 (two) times daily as needed for anxiety. 60 tablet  3  . ONETOUCH DELICA LANCETS 79U MISC Use to check blood sugar 2 times per day dx code E11.9 100 each 3  . ONETOUCH VERIO test strip USE AS INSTRUCTED TO CHECK BLOOD SUGAR 2 TIMES PER DAY DX CODE E11.9 100 each 3   No current facility-administered medications on file prior to visit.     BP 112/64 (BP Location: Left Arm, Patient Position: Sitting, Cuff Size: Normal)   Pulse 77   Temp 98 F (36.7 C) (Oral)   Ht 5\' 5"  (1.651 m)   Wt 200 lb 3.2 oz (90.8 kg)   LMP 03/15/2013   SpO2 98%   BMI 33.32 kg/m       Review of Systems  HENT: Positive for congestion, postnasal drip and sore throat.        Objective:   Physical Exam  Constitutional: She appears well-developed and well-nourished. No distress.  HENT:  Left Ear: External ear normal.  Mouth/Throat: Oropharynx is clear and moist.  Cardiovascular: Regular rhythm.   Pulmonary/Chest: Breath sounds normal. No respiratory distress.  Lymphadenopathy:    She has no cervical  adenopathy.          Assessment & Plan:   Uncontrolled diabetes.  Will continue glimepiride 4 mg daily Will resume Victoza and a starting dose of 0.6 milligrams daily Home blood sugar monitoring recommended Follow-up endocrinology  Mild URI.  Symptomatic therapy  Nyoka Cowden

## 2017-01-07 ENCOUNTER — Encounter: Payer: Self-pay | Admitting: Internal Medicine

## 2017-01-07 ENCOUNTER — Ambulatory Visit (INDEPENDENT_AMBULATORY_CARE_PROVIDER_SITE_OTHER): Payer: 59 | Admitting: Internal Medicine

## 2017-01-07 VITALS — BP 146/88 | HR 97 | Temp 98.3°F | Ht 65.0 in | Wt 204.2 lb

## 2017-01-07 DIAGNOSIS — F410 Panic disorder [episodic paroxysmal anxiety] without agoraphobia: Secondary | ICD-10-CM

## 2017-01-07 DIAGNOSIS — I10 Essential (primary) hypertension: Secondary | ICD-10-CM | POA: Diagnosis not present

## 2017-01-07 MED ORDER — ESCITALOPRAM OXALATE 10 MG PO TABS: 10.0000 mg | ORAL_TABLET | Freq: Every day | ORAL | 2 refills | Status: DC

## 2017-01-07 NOTE — Progress Notes (Signed)
Subjective:    Patient ID: Carla Williams, female    DOB: August 11, 1965, 51 y.o.   MRN: [id number]  HPI 51 year old patient who has hypertension and diabetes. She does have a history of anxiety and has been prescribed lorazepam that she takes sparingly.  For the past 2 weeks.  She has had increasing stress, mainly job-related.  Today at work she became quite overwhelmed, extremely anxious, flushed and had to leave work.  She describes a 2 week history Of poor sleep, excessive worry and she describes a sense of being overwhelmed with job-related stress She also states that she lost a relative 2 weeks ago, which has also been stressful She is a Education officer, museum.  Past Medical History:  Diagnosis Date  . Colon polyps    hyperplastic  . Diabetes mellitus   . GERD (gastroesophageal reflux disease)   . Hyperlipemia   . Hypertension   . Obesity      Social History   Social History  . Marital status: Married    Spouse name: N/A  . Number of children: 2  . Years of education: N/A   Occupational History  . Social Worker    Social History Main Topics  . Smoking status: Never Smoker  . Smokeless tobacco: Never Used  . Alcohol use No  . Drug use: No  . Sexual activity: Not on file   Other Topics Concern  . Not on file   Social History Narrative   One caffeine drink daily     Past Surgical History:  Procedure Laterality Date  . TUBAL LIGATION      Family History  Problem Relation Age of Onset  . Breast cancer Unknown        Maternal Great Aunt   . Diabetes Father        and Mother  . Heart disease Father   . Colon cancer Neg Hx   . Stomach cancer Neg Hx   . Esophageal cancer Neg Hx     No Known Allergies  Current Outpatient Prescriptions on File Prior to Visit  Medication Sig Dispense Refill  . atorvastatin (LIPITOR) 40 MG tablet TAKE 1 TABLET (40 MG TOTAL) BY MOUTH DAILY. ONE TABLET BY MOUTH ONCE DAILY 90 tablet 1  . benazepril-hydrochlorthiazide (LOTENSIN HCT)  10-12.5 MG tablet TAKE 1 TABLET BY MOUTH DAILY. 30 tablet 0  . esomeprazole (NEXIUM) 40 MG capsule TAKE 1 CAPSULE (40 MG TOTAL) BY MOUTH DAILY BEFORE BREAKFAST. AS NEEDED 90 capsule 0  . glimepiride (AMARYL) 4 MG tablet Take 1 tablet (4 mg total) by mouth daily before breakfast. 30 tablet 3  . ibuprofen (ADVIL,MOTRIN) 800 MG tablet TAKE 1 TABLET (800 MG TOTAL) BY MOUTH AS NEEDED. 60 tablet 1  . Insulin Pen Needle (NOVOTWIST) 32G X 5 MM MISC Use one per day to inject Victoza 50 each 3  . INVOKAMET XR [phone] MG TB24 TAKE 2 TABLETS DAILY 60 tablet 0  . liraglutide (VICTOZA) 18 MG/3ML SOPN Inject 0.1 mLs (0.6 mg total) into the skin daily. Inject 1.2 mg daily 2 pen 3  . LORazepam (ATIVAN) 0.5 MG tablet Take 1 tablet (0.5 mg total) by mouth 2 (two) times daily as needed for anxiety. 60 tablet 3  . ONETOUCH DELICA LANCETS 67H MISC Use to check blood sugar 2 times per day dx code E11.9 100 each 3  . ONETOUCH VERIO test strip USE AS INSTRUCTED TO CHECK BLOOD SUGAR 2 TIMES PER DAY DX CODE E11.9 100 each 3  No current facility-administered medications on file prior to visit.     BP (!) 146/88 (BP Location: Left Arm, Patient Position: Sitting, Cuff Size: Normal)   Pulse 97   Temp 98.3 F (36.8 C) (Oral)   Ht 5\' 5"  (1.651 m)   Wt 204 lb 3.2 oz (92.6 kg)   LMP 03/15/2013   SpO2 98%   BMI 33.98 kg/m    Review of Systems  Psychiatric/Behavioral: Positive for agitation and sleep disturbance. The patient is nervous/anxious.        Objective:   Physical Exam  Constitutional: She appears well-developed and well-nourished.  Tearful and anxious  Blood pressure 140/80          Assessment & Plan:   Situational stress.  Behavioral health referral.  Discussed and encouraged.  Contact information dispensed We'll start Lexapro 10 Four-week leave of absence   Dictated Continue lorazepam when necessary  Reassess in 4 weeks  [email or web address],PETER Pilar Plate

## 2017-01-07 NOTE — Patient Instructions (Addendum)
Lexapro 10 mg every morning  Consider behavioral health referral  Medical leave of absence for the next 4 weeks  Call if there is any clinical worsening Living With Anxiety After being diagnosed with an anxiety disorder, you may be relieved to know why you have felt or behaved a certain way. It is natural to also feel overwhelmed about the treatment ahead and what it will mean for your life. With care and support, you can manage this condition and recover from it. How to cope with anxiety Dealing with stress Stress is your body's reaction to life changes and events, both good and bad. Stress can last just a few hours or it can be ongoing. Stress can play a major role in anxiety, so it is important to learn both how to cope with stress and how to think about it differently. Talk with your health care provider or a counselor to learn more about stress reduction. He or she may suggest some stress reduction techniques, such as:  Music therapy. This can include creating or listening to music that you enjoy and that inspires you.  Mindfulness-based meditation. This involves being aware of your normal breaths, rather than trying to control your breathing. It can be done while sitting or walking.  Centering prayer. This is a kind of meditation that involves focusing on a word, phrase, or sacred image that is meaningful to you and that brings you peace.  Deep breathing. To do this, expand your stomach and inhale slowly through your nose. Hold your breath for 3-5 seconds. Then exhale slowly, allowing your stomach muscles to relax.  Self-talk. This is a skill where you identify thought patterns that lead to anxiety reactions and correct those thoughts.  Muscle relaxation. This involves tensing muscles then relaxing them.  Choose a stress reduction technique that fits your lifestyle and personality. Stress reduction techniques take time and practice. Set aside 5-15 minutes a day to do them.  Therapists can offer training in these techniques. The training may be covered by some insurance plans. Other things you can do to manage stress include:  Keeping a stress diary. This can help you learn what triggers your stress and ways to control your response.  Thinking about how you respond to certain situations. You may not be able to control everything, but you can control your reaction.  Making time for activities that help you relax, and not feeling guilty about spending your time in this way.  Therapy combined with coping and stress-reduction skills provides the best chance for successful treatment. Medicines Medicines can help ease symptoms. Medicines for anxiety include:  Anti-anxiety drugs.  Antidepressants.  Beta-blockers.  Medicines may be used as the main treatment for anxiety disorder, along with therapy, or if other treatments are not working. Medicines should be prescribed by a health care provider. Relationships Relationships can play a big part in helping you recover. Try to spend more time connecting with trusted friends and family members. Consider going to couples counseling, taking family education classes, or going to family therapy. Therapy can help you and others better understand the condition. How to recognize changes in your condition Everyone has a different response to treatment for anxiety. Recovery from anxiety happens when symptoms decrease and stop interfering with your daily activities at home or work. This may mean that you will start to:  Have better concentration and focus.  Sleep better.  Be less irritable.  Have more energy.  Have improved memory.  It is important to  recognize when your condition is getting worse. Contact your health care provider if your symptoms interfere with home or work and you do not feel like your condition is improving. Where to find help and support: You can get help and support from these sources:  Self-help  groups.  Online and [employer].  A trusted spiritual leader.  Couples counseling.  Family education classes.  Family therapy.  Follow these instructions at home:  Eat a healthy diet that includes plenty of vegetables, fruits, whole grains, low-fat dairy products, and lean protein. Do not eat a lot of foods that are high in solid fats, added sugars, or salt.  Exercise. Most adults should do the following: ? Exercise for at least 150 minutes each week. The exercise should increase your heart rate and make you sweat (moderate-intensity exercise). ? Strengthening exercises at least twice a week.  Cut down on caffeine, tobacco, alcohol, and other potentially harmful substances.  Get the right amount and quality of sleep. Most adults need 7-9 hours of sleep each night.  Make choices that simplify your life.  Take over-the-counter and prescription medicines only as told by your health care provider.  Avoid caffeine, alcohol, and certain over-the-counter cold medicines. These may make you feel worse. Ask your pharmacist which medicines to avoid.  Keep all follow-up visits as told by your health care provider. This is important. Questions to ask your health care provider  Would I benefit from therapy?  How often should I follow up with a health care provider?  How long do I need to take medicine?  Are there any long-term side effects of my medicine?  Are there any alternatives to taking medicine? Contact a health care provider if:  You have a hard time staying focused or finishing daily tasks.  You spend many hours a day feeling worried about everyday life.  You become exhausted by worry.  You start to have headaches, feel tense, or have nausea.  You urinate more than normal.  You have diarrhea. Get help right away if:  You have a racing heart and shortness of breath.  You have thoughts of hurting yourself or others. If you ever feel like you may hurt  yourself or others, or have thoughts about taking your own life, get help right away. You can go to your nearest emergency department or call:  Your local emergency services (911 in the [place]  A suicide crisis helpline, such as the [facility] at 667-100-2202. This is open 24-hours a day.  Summary  Taking steps to deal with stress can help calm you.  Medicines cannot cure anxiety disorders, but they can help ease symptoms.  Family, friends, and partners can play a big part in helping you recover from an anxiety disorder. This information is not intended to replace advice given to you by your health care provider. Make sure you discuss any questions you have with your health care provider. Document Released: 06/11/2016 Document Revised: 06/11/2016 Document Reviewed: 06/11/2016 Elsevier Interactive Patient Education  [employer].

## 2017-01-08 ENCOUNTER — Other Ambulatory Visit: Payer: Self-pay | Admitting: Internal Medicine

## 2017-01-16 ENCOUNTER — Telehealth: Payer: Self-pay | Admitting: Internal Medicine

## 2017-01-16 NOTE — Telephone Encounter (Signed)
error 

## 2017-01-17 ENCOUNTER — Telehealth: Payer: Self-pay | Admitting: Internal Medicine

## 2017-01-17 NOTE — Telephone Encounter (Addendum)
Patient received a letter that she needed to excuse her for leave of absence.  Her work is requesting what the medical issues is that she is missing work for as stated in her first letter.  They also need a time frame for absence.  Due to needing a 2nd letter patient is requesting this one as soon as possible, stating she needs to have it to HR by the 25th.  Please call [phone] when letter is complete -1st letter to review before writing letter and paperwork to review is in dr's folder

## 2017-01-20 ENCOUNTER — Encounter: Payer: Self-pay | Admitting: Internal Medicine

## 2017-01-21 NOTE — Telephone Encounter (Signed)
lmom for pt to call back

## 2017-01-24 NOTE — Telephone Encounter (Signed)
Pt is calling stating that she picked her letter up on Wednesday and she has not received the referral for [facility] and she state that she thought that Dr. Raliegh Ip wanted her to get in very soon but she has not heard anything as of today.  Pt state you can call her back at home and would like to have a call today.

## 2017-02-03 ENCOUNTER — Telehealth: Payer: Self-pay | Admitting: Internal Medicine

## 2017-02-03 NOTE — Telephone Encounter (Signed)
Pt calling to let you know that she has applied for STD (short term disability) with [employer] and to be on the look out for the form.

## 2017-02-04 NOTE — Telephone Encounter (Signed)
Noted thank you

## 2017-02-10 ENCOUNTER — Ambulatory Visit (INDEPENDENT_AMBULATORY_CARE_PROVIDER_SITE_OTHER): Payer: 59 | Admitting: Internal Medicine

## 2017-02-10 ENCOUNTER — Encounter: Payer: Self-pay | Admitting: Internal Medicine

## 2017-02-10 VITALS — BP 138/78 | HR 92 | Temp 98.2°F | Ht 65.0 in | Wt 204.6 lb

## 2017-02-10 DIAGNOSIS — F4323 Adjustment disorder with mixed anxiety and depressed mood: Secondary | ICD-10-CM

## 2017-02-10 NOTE — Progress Notes (Signed)
Subjective:    Patient ID: Carla Williams, female    DOB: 15-Jun-1966, 51 y.o.   MRN: [id number]  HPI  51 year old patient who is seen today in follow-up. She has type 2 diabetes and is followed by endocrinology She was seen 1 month ago with severe anxiety largely precipitated by work-related stress.  She has been seen by behavioral health [name]), which has been helpful.  She has been instructed on various relaxation techniques and plans to start a yoga or Zumba  Program.  She was placed on Lexapro 10 mg.  4 weeks ago, which she has tolerated well.  She was also given a prescription for when necessary lorazepam which she has not used in spite of some insomnia. She feels improved but still does not feel able to cope with the stresses of work.  Her counselor feels she needs additional time out of the workplace.  Disability papers completed  Past Medical History:  Diagnosis Date  . Colon polyps    hyperplastic  . Diabetes mellitus   . GERD (gastroesophageal reflux disease)   . Hyperlipemia   . Hypertension   . Obesity      Social History   Social History  . Marital status: Married    Spouse name: N/A  . Number of children: 2  . Years of education: N/A   Occupational History  . Social Worker    Social History Main Topics  . Smoking status: Never Smoker  . Smokeless tobacco: Never Used  . Alcohol use No  . Drug use: No  . Sexual activity: Not on file   Other Topics Concern  . Not on file   Social History Narrative   One caffeine drink daily     Past Surgical History:  Procedure Laterality Date  . TUBAL LIGATION      Family History  Problem Relation Age of Onset  . Breast cancer Unknown        Maternal Great Aunt   . Diabetes Father        and Mother  . Heart disease Father   . Colon cancer Neg Hx   . Stomach cancer Neg Hx   . Esophageal cancer Neg Hx     No Known Allergies  Current Outpatient Prescriptions on File Prior to Visit  Medication  Sig Dispense Refill  . atorvastatin (LIPITOR) 40 MG tablet TAKE 1 TABLET (40 MG TOTAL) BY MOUTH DAILY. ONE TABLET BY MOUTH ONCE DAILY 90 tablet 1  . benazepril-hydrochlorthiazide (LOTENSIN HCT) 10-12.5 MG tablet TAKE 1 TABLET BY MOUTH DAILY. 30 tablet 0  . escitalopram (LEXAPRO) 10 MG tablet Take 1 tablet (10 mg total) by mouth daily. 60 tablet 2  . esomeprazole (NEXIUM) 40 MG capsule TAKE 1 CAPSULE (40 MG TOTAL) BY MOUTH DAILY BEFORE BREAKFAST. AS NEEDED 90 capsule 0  . glimepiride (AMARYL) 4 MG tablet Take 1 tablet (4 mg total) by mouth daily before breakfast. 30 tablet 3  . ibuprofen (ADVIL,MOTRIN) 800 MG tablet TAKE 1 TABLET (800 MG TOTAL) BY MOUTH AS NEEDED. 60 tablet 1  . Insulin Pen Needle (NOVOTWIST) 32G X 5 MM MISC Use one per day to inject Victoza 50 each 3  . INVOKAMET XR [phone] MG TB24 TAKE 2 TABLETS DAILY 60 tablet 2  . liraglutide (VICTOZA) 18 MG/3ML SOPN Inject 0.1 mLs (0.6 mg total) into the skin daily. Inject 1.2 mg daily 2 pen 3  . ONETOUCH DELICA LANCETS 25K MISC Use to check blood sugar 2  times per day dx code E11.9 100 each 3  . ONETOUCH VERIO test strip USE AS INSTRUCTED TO CHECK BLOOD SUGAR 2 TIMES PER DAY DX CODE E11.9 100 each 3   No current facility-administered medications on file prior to visit.     BP 138/78 (BP Location: Left Arm, Patient Position: Sitting, Cuff Size: Normal)   Pulse 92   Temp 98.2 F (36.8 C) (Oral)   Ht 5\' 5"  (1.651 m)   Wt 204 lb 9.6 oz (92.8 kg)   LMP 03/15/2013   SpO2 98%   BMI 34.05 kg/m     Review of Systems  Constitutional: Negative.   HENT: Negative for congestion, dental problem, hearing loss, rhinorrhea, sinus pressure, sore throat and tinnitus.   Eyes: Negative for pain, discharge and visual disturbance.  Respiratory: Negative for cough and shortness of breath.   Cardiovascular: Negative for chest pain, palpitations and leg swelling.  Gastrointestinal: Negative for abdominal distention, abdominal pain, blood in stool,  constipation, diarrhea, nausea and vomiting.  Genitourinary: Negative for difficulty urinating, dysuria, flank pain, frequency, hematuria, pelvic pain, urgency, vaginal bleeding, vaginal discharge and vaginal pain.  Musculoskeletal: Negative for arthralgias, gait problem and joint swelling.  Skin: Negative for rash.  Neurological: Negative for dizziness, syncope, speech difficulty, weakness, numbness and headaches.  Hematological: Negative for adenopathy.  Psychiatric/Behavioral: Positive for behavioral problems, decreased concentration, dysphoric mood and sleep disturbance. Negative for agitation, confusion, hallucinations, self-injury and suicidal ideas. The patient is nervous/anxious. The patient is not hyperactive.        Objective:   Physical Exam  Constitutional: She appears well-developed and well-nourished. She appears distressed.  Psychiatric: She has a normal mood and affect. Her behavior is normal. Judgment and thought content normal.  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  Follow behavioral health Continue medical leave of absence for an additional 4 weeks.  Reevaluate at that time.  Continue Lexapro 10 Diabetes mellitus.  Follow-up endocrinology  Follow-up here 4 weeks  Disability forms completed  Nyoka Cowden

## 2017-02-10 NOTE — Patient Instructions (Signed)
Continue behavioral health follow-up  Return here in one month for follow-up

## 2017-03-10 ENCOUNTER — Ambulatory Visit (INDEPENDENT_AMBULATORY_CARE_PROVIDER_SITE_OTHER): Payer: 59 | Admitting: Internal Medicine

## 2017-03-10 ENCOUNTER — Encounter: Payer: Self-pay | Admitting: Internal Medicine

## 2017-03-10 VITALS — BP 132/80 | HR 90 | Temp 98.0°F | Ht 65.0 in | Wt 210.0 lb

## 2017-03-10 DIAGNOSIS — E119 Type 2 diabetes mellitus without complications: Secondary | ICD-10-CM | POA: Diagnosis not present

## 2017-03-10 DIAGNOSIS — I10 Essential (primary) hypertension: Secondary | ICD-10-CM

## 2017-03-10 DIAGNOSIS — F4322 Adjustment disorder with anxiety: Secondary | ICD-10-CM | POA: Diagnosis not present

## 2017-03-10 NOTE — Progress Notes (Signed)
Subjective:    Patient ID: Carla Williams, female    DOB: 12-Feb-1966, 51 y.o.   MRN: [id number]  HPI  51 year old patient who is seen today in follow-up. She is followed by endocrinology and has been seen here recently for an adjustment disorder with  Mixed anxiety and depressed mood associated with considerable situational office stress.  She has done quite well with a medical leave of absence.  She has been followed by behavioral health but has been released.  She is doing quite well on her present regimen and feels that she is ready to return to the workplace.  Medical regimen includes Lexapro.  She states that she is sleeping much better  Past Medical History:  Diagnosis Date  . Colon polyps    hyperplastic  . Diabetes mellitus   . GERD (gastroesophageal reflux disease)   . Hyperlipemia   . Hypertension   . Obesity      Social History   Social History  . Marital status: Married    Spouse name: N/A  . Number of children: 2  . Years of education: N/A   Occupational History  . Social Worker    Social History Main Topics  . Smoking status: Never Smoker  . Smokeless tobacco: Never Used  . Alcohol use No  . Drug use: No  . Sexual activity: Not on file   Other Topics Concern  . Not on file   Social History Narrative   One caffeine drink daily     Past Surgical History:  Procedure Laterality Date  . TUBAL LIGATION      Family History  Problem Relation Age of Onset  . Breast cancer Unknown        Maternal Great Aunt   . Diabetes Father        and Mother  . Heart disease Father   . Colon cancer Neg Hx   . Stomach cancer Neg Hx   . Esophageal cancer Neg Hx     No Known Allergies  Current Outpatient Prescriptions on File Prior to Visit  Medication Sig Dispense Refill  . atorvastatin (LIPITOR) 40 MG tablet TAKE 1 TABLET (40 MG TOTAL) BY MOUTH DAILY. ONE TABLET BY MOUTH ONCE DAILY 90 tablet 1  . benazepril-hydrochlorthiazide (LOTENSIN HCT) 10-12.5 MG tablet  TAKE 1 TABLET BY MOUTH DAILY. 30 tablet 0  . escitalopram (LEXAPRO) 10 MG tablet Take 1 tablet (10 mg total) by mouth daily. 60 tablet 2  . esomeprazole (NEXIUM) 40 MG capsule TAKE 1 CAPSULE (40 MG TOTAL) BY MOUTH DAILY BEFORE BREAKFAST. AS NEEDED 90 capsule 0  . glimepiride (AMARYL) 4 MG tablet Take 1 tablet (4 mg total) by mouth daily before breakfast. 30 tablet 3  . ibuprofen (ADVIL,MOTRIN) 800 MG tablet TAKE 1 TABLET (800 MG TOTAL) BY MOUTH AS NEEDED. 60 tablet 1  . Insulin Pen Needle (NOVOTWIST) 32G X 5 MM MISC Use one per day to inject Victoza 50 each 3  . INVOKAMET XR [phone] MG TB24 TAKE 2 TABLETS DAILY 60 tablet 2  . liraglutide (VICTOZA) 18 MG/3ML SOPN Inject 0.1 mLs (0.6 mg total) into the skin daily. Inject 1.2 mg daily 2 pen 3  . ONETOUCH DELICA LANCETS 98P MISC Use to check blood sugar 2 times per day dx code E11.9 100 each 3  . ONETOUCH VERIO test strip USE AS INSTRUCTED TO CHECK BLOOD SUGAR 2 TIMES PER DAY DX CODE E11.9 100 each 3   No current facility-administered medications on file prior  to visit.     BP 132/80 (BP Location: Left Arm, Patient Position: Sitting, Cuff Size: Normal)   Pulse 90   Temp 98 F (36.7 C) (Oral)   Ht 5\' 5"  (1.651 m)   Wt 210 lb (95.3 kg)   LMP 03/15/2013   SpO2 98%   BMI 34.95 kg/m     Review of Systems  Constitutional: Negative.   HENT: Negative for congestion, dental problem, hearing loss, rhinorrhea, sinus pressure, sore throat and tinnitus.   Eyes: Negative for pain, discharge and visual disturbance.  Respiratory: Negative for cough and shortness of breath.   Cardiovascular: Negative for chest pain, palpitations and leg swelling.  Gastrointestinal: Negative for abdominal distention, abdominal pain, blood in stool, constipation, diarrhea, nausea and vomiting.  Genitourinary: Negative for difficulty urinating, dysuria, flank pain, frequency, hematuria, pelvic pain, urgency, vaginal bleeding, vaginal discharge and vaginal pain.    Musculoskeletal: Negative for arthralgias, gait problem and joint swelling.  Skin: Negative for rash.  Neurological: Negative for dizziness, syncope, speech difficulty, weakness, numbness and headaches.  Hematological: Negative for adenopathy.  Psychiatric/Behavioral: Positive for sleep disturbance. Negative for agitation, behavioral problems and dysphoric mood. The patient is nervous/anxious.        Objective:   Physical Exam  Constitutional: She appears well-developed and well-nourished. No distress.  Blood pressure 130/80  Psychiatric: She has a normal mood and affect. Her behavior is normal. Judgment and thought content normal.          Assessment & Plan:   Adjustment disorder with mixed anxiety and depressed mood.  Patient has responded quite well to therapy, which has included SSRI as well as CBT.  She feels that she has improved and is ready to return to the work place. A note written to return to work Return here in 3 months or as needed  [employer]

## 2017-03-10 NOTE — Patient Instructions (Signed)
Endocrinology follow-up as scheduled  Limit your sodium (Salt) intake    It is important that you exercise regularly, at least 20 minutes 3 to 4 times per week.  If you develop chest pain or shortness of breath seek  medical attention.  Please check your blood pressure on a regular basis.  If it is consistently greater than 150/90, please make an office appointment.  Return in 6 months for follow-up

## 2017-03-13 ENCOUNTER — Other Ambulatory Visit: Payer: Self-pay | Admitting: Internal Medicine

## 2017-03-20 ENCOUNTER — Encounter: Payer: Self-pay | Admitting: Internal Medicine

## 2017-04-08 ENCOUNTER — Other Ambulatory Visit: Payer: Self-pay | Admitting: Internal Medicine

## 2017-08-01 ENCOUNTER — Other Ambulatory Visit: Payer: Self-pay | Admitting: Internal Medicine

## 2017-09-01 ENCOUNTER — Telehealth: Payer: Self-pay | Admitting: Internal Medicine

## 2017-09-01 ENCOUNTER — Telehealth: Payer: Self-pay | Admitting: Family Medicine

## 2017-09-01 ENCOUNTER — Ambulatory Visit: Payer: 59 | Admitting: Family Medicine

## 2017-09-01 ENCOUNTER — Other Ambulatory Visit: Payer: Self-pay

## 2017-09-01 ENCOUNTER — Encounter: Payer: Self-pay | Admitting: Family Medicine

## 2017-09-01 VITALS — BP 112/82 | HR 85 | Temp 98.7°F | Wt 214.0 lb

## 2017-09-01 DIAGNOSIS — J069 Acute upper respiratory infection, unspecified: Secondary | ICD-10-CM

## 2017-09-01 DIAGNOSIS — H6123 Impacted cerumen, bilateral: Secondary | ICD-10-CM | POA: Diagnosis not present

## 2017-09-01 DIAGNOSIS — B9789 Other viral agents as the cause of diseases classified elsewhere: Secondary | ICD-10-CM | POA: Diagnosis not present

## 2017-09-01 DIAGNOSIS — R0982 Postnasal drip: Secondary | ICD-10-CM | POA: Diagnosis not present

## 2017-09-01 MED ORDER — DEXTROMETHORPHAN POLISTIREX ER 30 MG/5ML PO SUER: 60.0000 mg | ORAL | 0 refills | Status: DC | PRN

## 2017-09-01 MED ORDER — FLUTICASONE PROPIONATE 50 MCG/ACT NA SUSP: 1.0000 | Freq: Every day | NASAL | 0 refills | Status: DC

## 2017-09-01 NOTE — Progress Notes (Signed)
Subjective:    Patient ID: Carla Williams, female    DOB: 04-21-1966, 52 y.o.   MRN: [id number]  Chief Complaint  Patient presents with  . Nasal Congestion  . Cough  . Chills  . Sore Throat    Is slowly going away now  . Fatigue  . Headache  . Ear Fullness    Only with R ear    HPI Pt was seen today for acute concern.  Pt endorses sneezing which started Thursday/Friday of last week.  Pt also endorses nasal congestion, sore throat, cough slight headache which started since then.  Pt has been taking NyQuil and DayQuil for her symptoms.  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  Past Medical History:  Diagnosis Date  . Colon polyps    hyperplastic  . Diabetes mellitus   . GERD (gastroesophageal reflux disease)   . Hyperlipemia   . Hypertension   . Obesity     No Known Allergies  ROS General: Denies fever, night sweats, changes in weight, changes in appetite     +chills HEENT: Denies ear pain, changes in vision  +rhinorrhea, sore throat, HA, nasal congestion CV: Denies CP, palpitations, SOB, orthopnea Pulm: Denies SOB, wheezing  +cough GI: Denies abdominal pain, nausea, vomiting, diarrhea, constipation GU: Denies dysuria, hematuria, frequency, vaginal discharge Msk: Denies muscle cramps, joint pains Neuro: Denies weakness, numbness, tingling Skin: Denies rashes, bruising Psych: Denies depression, anxiety, hallucinations     Objective:    Blood pressure 112/82, pulse 85, temperature 98.7 F (37.1 C), temperature source Oral, weight 214 lb (97.1 kg), last menstrual period 03/15/2013, SpO2 98 %.   Gen. Pleasant, well-nourished, in no distress, normal affect   HEENT: [place]/AT, face symmetric, no scleral icterus, PERRLA, nares patent with clear drainage, pharynx with post nasal drainage, mild erythema, no exudate.  TMs occluded with cerumen bilaterally.  After irrigation, TMs full bilaterally.  No cervical lymphadenopathy. Lungs: no accessory muscle use, CTAB, no  wheezes or rales Cardiovascular: RRR, no m/r/g, no peripheral edema Abdomen: BS present, soft, NT/ND Neuro:  A&Ox3, CN II-XII intact, normal gait   Wt Readings from Last 3 Encounters:  09/01/17 214 lb (97.1 kg)  03/10/17 210 lb (95.3 kg)  02/10/17 204 lb 9.6 oz (92.8 kg)    Lab Results  Component Value Date   WBC 3.5 (L) 12/13/2016   HGB 14.7 12/13/2016   HCT 43.3 12/13/2016   PLT 235.0 12/13/2016   GLUCOSE 339 (H) 12/13/2016   CHOL 180 03/22/2016   TRIG 186.0 (H) 03/22/2016   HDL 53.80 03/22/2016   LDLDIRECT 100.0 09/29/2014   LDLCALC 89 03/22/2016   ALT 50 (H) 12/13/2016   AST 39 (H) 12/13/2016   NA 139 12/13/2016   K 4.3 12/13/2016   CL 102 12/13/2016   CREATININE 0.67 12/13/2016   BUN 11 12/13/2016   CO2 28 12/13/2016   TSH 0.93 12/13/2016   HGBA1C 12.7 (H) 12/13/2016   MICROALBUR 0.8 12/13/2016    Assessment/Plan:  Viral URI with cough  -supportive care -Okay to continue OTC cold meds -pt requesting a rx for OTC cough syrup so that she can use her flex spending.  Rx given for delsym. -pt requesting a note for work. -Follow-up PRN  Post-nasal drainage -rx given for Flonase  Bilateral impacted cerumen -b/l ear irrigation.  Consent obtained.  Patient tolerated procedure well  Follow-up PRN  Grier Mitts, MD

## 2017-09-01 NOTE — Telephone Encounter (Signed)
Okay to write new letter? How long should patient stay home from work?

## 2017-09-01 NOTE — Telephone Encounter (Signed)
Patient saw Dr banks today.  While in the office, patient received a note for work.  In the line stating ...taking into consideration when assessing time away from work, there is an error that needs to be fixed.  Also, patient needs letter to state how many days she should be out of work.  Patient will come by and pick up letter.

## 2017-09-01 NOTE — Patient Instructions (Addendum)
Continue to use Flonase nasal spray.  You can gargle with warm salt water or chloraseptic spray to help with your sore throat. Viral Respiratory Infection A respiratory infection is an illness that affects part of the respiratory system, such as the lungs, nose, or throat. Most respiratory infections are caused by either viruses or bacteria. A respiratory infection that is caused by a virus is called a viral respiratory infection. Common types of viral respiratory infections include:  A cold.  The flu (influenza).  A respiratory syncytial virus (RSV) infection.  How do I know if I have a viral respiratory infection? Most viral respiratory infections cause:  A stuffy or runny nose.  Yellow or green nasal discharge.  A cough.  Sneezing.  Fatigue.  Achy muscles.  A sore throat.  Sweating or chills.  A fever.  A headache.  How are viral respiratory infections treated? If influenza is diagnosed early, it may be treated with an antiviral medicine that shortens the length of time a person has symptoms. Symptoms of viral respiratory infections may be treated with over-the-counter and prescription medicines, such as:  Expectorants. These make it easier to cough up mucus.  Decongestant nasal sprays.  Health care providers do not prescribe antibiotic medicines for viral infections. This is because antibiotics are designed to kill bacteria. They have no effect on viruses. How do I know if I should stay home from work or school? To avoid exposing others to your respiratory infection, stay home if you have:  A fever.  A persistent cough.  A sore throat.  A runny nose.  Sneezing.  Muscles aches.  Headaches.  Fatigue.  Weakness.  Chills.  Sweating.  Nausea.  Follow these instructions at home:  Rest as much as possible.  Take over-the-counter and prescription medicines only as told by your health care provider.  Drink enough fluid to keep your urine clear or  pale yellow. This helps prevent dehydration and helps loosen up mucus.  Gargle with a salt-water mixture 3-4 times per day or as needed. To make a salt-water mixture, completely dissolve -1 tsp of salt in 1 cup of warm water.  Use nose drops made from salt water to ease congestion and soften raw skin around your nose.  Do not drink alcohol.  Do not use tobacco products, including cigarettes, chewing tobacco, and e-cigarettes. If you need help quitting, ask your health care provider. Contact a health care provider if:  Your symptoms last for 10 days or longer.  Your symptoms get worse over time.  You have a fever.  You have severe sinus pain in your face or forehead.  The glands in your jaw or neck become very swollen. Get help right away if:  You feel pain or pressure in your chest.  You have shortness of breath.  You faint or feel like you will faint.  You have severe and persistent vomiting.  You feel confused or disoriented. This information is not intended to replace advice given to you by your health care provider. Make sure you discuss any questions you have with your health care provider. Document Released: 03/27/2005 Document Revised: 11/23/2015 Document Reviewed: 11/23/2014 Elsevier Interactive Patient Education  2018 [place] Drip Postnasal drip is the feeling of mucus going down the back of your throat. Mucus is a slimy substance that moistens and cleans your nose and throat, as well as the air pockets in face bones near your forehead and cheeks (sinuses). Small amounts of mucus pass from  your nose and sinuses down the back of your throat all the time. This is normal. When you produce too much mucus or the mucus gets too thick, you can feel it. Some common causes of postnasal drip include:  Having more mucus because of: ? A cold or the flu. ? Allergies. ? Cold air. ? Certain medicines.  Having more mucus that is thicker because of: ? A sinus  or nasal infection. ? Dry air. ? A food allergy.  Follow these instructions at home: Relieving discomfort  Gargle with a salt-water mixture 3-4 times a day or as needed. To make a salt-water mixture, completely dissolve -1 tsp of salt in 1 cup of warm water.  If the air in your home is dry, use a humidifier to add moisture to the air.  Use a saline spray or container (neti pot) to flush out the nose (nasal irrigation). These methods can help clear away mucus and keep the nasal passages moist. General instructions  Take over-the-counter and prescription medicines only as told by your health care provider.  Follow instructions from your health care provider about eating or drinking restrictions. You may need to avoid caffeine.  Avoid things that you know you are allergic to (allergens), like dust, mold, pollen, pets, or certain foods.  Drink enough fluid to keep your urine pale yellow.  Keep all follow-up visits as told by your health care provider. This is important. Contact a health care provider if:  You have a fever.  You have a sore throat.  You have difficulty swallowing.  You have headache.  You have sinus pain.  You have a cough that does not go away.  The mucus from your nose becomes thick and is green or yellow in color.  You have cold or flu symptoms that last more than 10 days. Summary  Postnasal drip is the feeling of mucus going down the back of your throat.  If your health care provider approves, use nasal irrigation or a nasal spray 2?4 times a day.  Avoid things that you know you are allergic to (allergens), like dust, mold, pollen, pets, or certain foods. This information is not intended to replace advice given to you by your health care provider. Make sure you discuss any questions you have with your health care provider. Document Released: 09/30/2016 Document Revised: 09/30/2016 Document Reviewed: 09/30/2016 Elsevier Interactive Patient Education   [employer].

## 2017-09-01 NOTE — Telephone Encounter (Signed)
Discard

## 2017-09-02 ENCOUNTER — Other Ambulatory Visit: Payer: Self-pay | Admitting: Internal Medicine

## 2017-09-03 NOTE — Telephone Encounter (Signed)
Pt can go back to work.  As of that visit she had a cold.

## 2017-09-04 ENCOUNTER — Encounter: Payer: Self-pay | Admitting: *Deleted

## 2017-09-04 NOTE — Telephone Encounter (Signed)
Called patient and left message to return call. Corrected typo and printed new letter and filed at front desk for pick up. Letter can also be printed from pt's MyChart.

## 2017-09-05 NOTE — Telephone Encounter (Signed)
Called patient and left message to return call

## 2017-09-06 ENCOUNTER — Encounter: Payer: Self-pay | Admitting: Internal Medicine

## 2017-09-06 ENCOUNTER — Ambulatory Visit: Payer: 59 | Admitting: Internal Medicine

## 2017-09-06 VITALS — BP 118/82 | HR 80 | Temp 97.6°F | Ht 65.0 in | Wt 212.0 lb

## 2017-09-06 DIAGNOSIS — R110 Nausea: Secondary | ICD-10-CM

## 2017-09-06 DIAGNOSIS — J069 Acute upper respiratory infection, unspecified: Secondary | ICD-10-CM | POA: Insufficient documentation

## 2017-09-06 DIAGNOSIS — R42 Dizziness and giddiness: Secondary | ICD-10-CM

## 2017-09-06 MED ORDER — ONDANSETRON HCL 4 MG PO TABS: 4.0000 mg | ORAL_TABLET | Freq: Three times a day (TID) | ORAL | 0 refills | Status: DC | PRN

## 2017-09-06 MED ORDER — MECLIZINE HCL 12.5 MG PO TABS: 12.5000 mg | ORAL_TABLET | Freq: Three times a day (TID) | ORAL | 1 refills | Status: AC | PRN

## 2017-09-06 NOTE — Progress Notes (Signed)
Subjective:    Patient ID: Carla Williams, female    DOB: 12-Oct-1965, 52 y.o.   MRN: [id number]  HPI   Here with 7 days acute onset fever, facial pain, pressure, headache, general weakness and malaise, and clearish d/c, with mild ST and cough - all of which is currently much improved, but now has 1 days significant positional dizziness/vertigo with sitting up or turning over in bed, lead to significant nausea but no vomiting.  No reduced hearing, ear pain.  Pt denies chest pain, wheezing, increased sob or doe, orthopnea, PND, increased LE swelling, palpitations, or syncope.    Pt denies polydipsia, polyuria  Works as Education officer, [employer] with multiple recent sick contacts Past Medical History:  Diagnosis Date  . Colon polyps    hyperplastic  . Diabetes mellitus   . GERD (gastroesophageal reflux disease)   . Hyperlipemia   . Hypertension   . Obesity    Past Surgical History:  Procedure Laterality Date  . TUBAL LIGATION      reports that  has never smoked. she has never used smokeless tobacco. She reports that she does not drink alcohol or use drugs. family history includes Breast cancer in her unknown relative; Diabetes in her father; Heart disease in her father. No Known Allergies Current Outpatient Medications on File Prior to Visit  Medication Sig Dispense Refill  . atorvastatin (LIPITOR) 40 MG tablet TAKE 1 TABLET (40 MG TOTAL) BY MOUTH DAILY. ONE TABLET BY MOUTH ONCE DAILY 90 tablet 0  . benazepril-hydrochlorthiazide (LOTENSIN HCT) 10-12.5 MG tablet TAKE 1 TABLET BY MOUTH DAILY. 30 tablet 0  . dextromethorphan (DELSYM) 30 MG/5ML liquid Take 10 mLs (60 mg total) by mouth as needed for cough. 89 mL 0  . escitalopram (LEXAPRO) 10 MG tablet Take 1 tablet (10 mg total) by mouth daily. 60 tablet 2  . esomeprazole (NEXIUM) 40 MG capsule TAKE 1 CAPSULE BY MOUTH DAILY BEFORE BREAKFAST. AS NEEDED 90 capsule 0  . fluticasone (FLONASE) 50 MCG/ACT nasal spray Place 1 spray into both nostrils daily.  16 g 0  . glimepiride (AMARYL) 4 MG tablet Take 1 tablet (4 mg total) by mouth daily before breakfast. 30 tablet 3  . ibuprofen (ADVIL,MOTRIN) 800 MG tablet TAKE 1 TABLET (800 MG TOTAL) BY MOUTH AS NEEDED. 60 tablet 1  . Insulin Pen Needle (NOVOTWIST) 32G X 5 MM MISC Use one per day to inject Victoza 50 each 3  . INVOKAMET XR [phone] MG TB24 TAKE 2 TABLETS BY MOUTH EVERY DAY 60 tablet 2  . liraglutide (VICTOZA) 18 MG/3ML SOPN Inject 0.1 mLs (0.6 mg total) into the skin daily. Inject 1.2 mg daily 2 pen 3  . ONETOUCH DELICA LANCETS 10C MISC Use to check blood sugar 2 times per day dx code E11.9 100 each 3  . ONETOUCH VERIO test strip USE AS INSTRUCTED TO CHECK BLOOD SUGAR 2 TIMES PER DAY DX CODE E11.9 100 each 3   No current facility-administered medications on file prior to visit.    Review of Systems  Constitutional: Negative for other unusual diaphoresis or sweats HENT: Negative for ear discharge or swelling Eyes: Negative for other worsening visual disturbances Respiratory: Negative for stridor or other swelling  Gastrointestinal: Negative for worsening distension or other blood Genitourinary: Negative for retention or other urinary change Musculoskeletal: Negative for other MSK pain or swelling Skin: Negative for color change or other new lesions Neurological: Negative for worsening tremors and other numbness  Psychiatric/Behavioral: Negative for worsening agitation or  other fatigue All other system neg per pt    Objective:   Physical Exam BP 118/82   Pulse 80   Temp 97.6 F (36.4 C) (Oral)   Ht 5\' 5"  (1.651 m)   Wt 212 lb (96.2 kg)   LMP 03/15/2013   SpO2 96%   BMI 35.28 kg/m  VS noted, not ill appearing Constitutional: Pt appears in NAD HENT: Head: NCAT.  Right Ear: External ear normal.  Left Ear: External ear normal.  Eyes: . Pupils are equal, round, and reactive to light. Conjunctivae and EOM are normal Bilat tm's with mild erythema.  Max sinus areas non tender.   Pharynx with mild erythema, no exudate Nose: without d/c or deformity Neck: Neck supple. Gross normal ROM Cardiovascular: Normal rate and regular rhythm.   Pulmonary/Chest: Effort normal and breath sounds without rales or wheezing.  Abd:  Soft, NT, ND, + BS, no organomegaly Neurological: Pt is alert. At baseline orientation, motor 5/5 intact, no nystagmus Skin: Skin is warm. No rashes, other new lesions, no LE edema Psychiatric: Pt behavior is normal without agitation  No other exam findings  Lab Results  Component Value Date   WBC 3.5 (L) 12/13/2016   HGB 14.7 12/13/2016   HCT 43.3 12/13/2016   PLT 235.0 12/13/2016   GLUCOSE 339 (H) 12/13/2016   CHOL 180 03/22/2016   TRIG 186.0 (H) 03/22/2016   HDL 53.80 03/22/2016   LDLDIRECT 100.0 09/29/2014   LDLCALC 89 03/22/2016   ALT 50 (H) 12/13/2016   AST 39 (H) 12/13/2016   NA 139 12/13/2016   K 4.3 12/13/2016   CL 102 12/13/2016   CREATININE 0.67 12/13/2016   BUN 11 12/13/2016   CO2 28 12/13/2016   TSH 0.93 12/13/2016   HGBA1C 12.7 (H) 12/13/2016   MICROALBUR 0.8 12/13/2016       Assessment & Plan:

## 2017-09-06 NOTE — Patient Instructions (Addendum)
Please take all new medication as prescribed - the meclizine for vertigo, and zofran as needed for nausea  Please continue all other medications as before, and refills have been done if requested.  Please have the pharmacy call with any other refills you may need.  Please keep your appointments with your specialists as you may have planned

## 2017-09-06 NOTE — Assessment & Plan Note (Signed)
Also for zofran prn for symptom control

## 2017-09-06 NOTE — Assessment & Plan Note (Signed)
Mild, likely peripheral middle ear related, for meclizine prn, mucinex prn,  to f/u any worsening symptoms or concerns

## 2017-09-06 NOTE — Assessment & Plan Note (Signed)
Resolving symptoms, afeb, cont to follow

## 2017-09-08 ENCOUNTER — Ambulatory Visit: Payer: 59 | Admitting: Internal Medicine

## 2017-09-08 ENCOUNTER — Encounter: Payer: Self-pay | Admitting: Internal Medicine

## 2017-09-08 VITALS — BP 122/78 | HR 80 | Temp 98.3°F | Wt 218.0 lb

## 2017-09-08 DIAGNOSIS — E119 Type 2 diabetes mellitus without complications: Secondary | ICD-10-CM | POA: Diagnosis not present

## 2017-09-08 DIAGNOSIS — I10 Essential (primary) hypertension: Secondary | ICD-10-CM | POA: Diagnosis not present

## 2017-09-08 LAB — POCT GLYCOSYLATED HEMOGLOBIN (HGB A1C): Hemoglobin A1C: 7.2

## 2017-09-08 MED ORDER — IBUPROFEN 800 MG PO TABS: 800.0000 mg | ORAL_TABLET | ORAL | 1 refills | Status: DC | PRN

## 2017-09-08 MED ORDER — ESCITALOPRAM OXALATE 10 MG PO TABS: 10.0000 mg | ORAL_TABLET | Freq: Every day | ORAL | 2 refills | Status: DC

## 2017-09-08 NOTE — Telephone Encounter (Signed)
Pt notified of results/instructions and verbalized understanding. She is in the office today for an appointment.

## 2017-09-08 NOTE — Patient Instructions (Signed)
Limit your sodium (Salt) intake  Please check your blood pressure on a regular basis.  If it is consistently greater than 150/90, please make an office appointment.    It is important that you exercise regularly, at least 20 minutes 3 to 4 times per week.  If you develop chest pain or shortness of breath seek  medical attention.  You need to lose weight.  Consider a lower calorie diet and regular exercise.  Please see your eye doctor yearly to check for diabetic eye damage  Return in 6 months for follow-up  Endocrinology follow-up as scheduled

## 2017-09-08 NOTE — Progress Notes (Signed)
Subjective:    Patient ID: Carla Williams, female    DOB: 1966-01-28, 52 y.o.   MRN: [id number]  HPI  Lab Results  Component Value Date   HGBA1C 12.7 (H) 12/13/2016   BP Readings from Last 3 Encounters:  09/08/17 122/78  09/06/17 118/82  09/01/17 26/32   52 year old patient who is seen today for her 5-month follow-up.  She was seen by primary care twice last week due to a URI and vertigo.  Symptoms are greatly improved and she feels well today. She is being followed by endocrinology.  She states in January hemoglobin A1c was well controlled  Hemoglobin A1c today 7.2  She has essential hypertension and remains on statin therapy for dyslipidemia.  She has obesity.  Denies any cardiopulmonary complaints. Last diabetic eye examination June 2018 Diabetic foot examination performed in January of this year  Past Medical History:  Diagnosis Date  . Colon polyps    hyperplastic  . Diabetes mellitus   . GERD (gastroesophageal reflux disease)   . Hyperlipemia   . Hypertension   . Obesity      Social History   Socioeconomic History  . Marital status: Married    Spouse name: Not on file  . Number of children: 2  . Years of education: Not on file  . Highest education level: Not on file  Social Needs  . Financial resource strain: Not on file  . Food insecurity - worry: Not on file  . Food insecurity - inability: Not on file  . Transportation needs - medical: Not on file  . Transportation needs - non-medical: Not on file  Occupational History  . Occupation: Education officer, museum  Tobacco Use  . Smoking status: Never Smoker  . Smokeless tobacco: Never Used  Substance and Sexual Activity  . Alcohol use: No  . Drug use: No  . Sexual activity: Not on file  Other Topics Concern  . Not on file  Social History Narrative   One caffeine drink daily     Past Surgical History:  Procedure Laterality Date  . TUBAL LIGATION      Family History  Problem Relation Age of Onset  .  Breast cancer Unknown        Maternal Great Aunt   . Diabetes Father        and Mother  . Heart disease Father   . Colon cancer Neg Hx   . Stomach cancer Neg Hx   . Esophageal cancer Neg Hx     No Known Allergies  Current Outpatient Medications on File Prior to Visit  Medication Sig Dispense Refill  . atorvastatin (LIPITOR) 40 MG tablet TAKE 1 TABLET (40 MG TOTAL) BY MOUTH DAILY. ONE TABLET BY MOUTH ONCE DAILY 90 tablet 0  . benazepril-hydrochlorthiazide (LOTENSIN HCT) 10-12.5 MG tablet TAKE 1 TABLET BY MOUTH DAILY. 30 tablet 0  . dextromethorphan (DELSYM) 30 MG/5ML liquid Take 10 mLs (60 mg total) by mouth as needed for cough. 89 mL 0  . esomeprazole (NEXIUM) 40 MG capsule TAKE 1 CAPSULE BY MOUTH DAILY BEFORE BREAKFAST. AS NEEDED 90 capsule 0  . fluticasone (FLONASE) 50 MCG/ACT nasal spray Place 1 spray into both nostrils daily. 16 g 0  . glimepiride (AMARYL) 4 MG tablet Take 1 tablet (4 mg total) by mouth daily before breakfast. 30 tablet 3  . Insulin Pen Needle (NOVOTWIST) 32G X 5 MM MISC Use one per day to inject Victoza 50 each 3  . INVOKAMET XR [phone] MG TB24  TAKE 2 TABLETS BY MOUTH EVERY DAY 60 tablet 2  . liraglutide (VICTOZA) 18 MG/3ML SOPN Inject 0.1 mLs (0.6 mg total) into the skin daily. Inject 1.2 mg daily 2 pen 3  . meclizine (ANTIVERT) 12.5 MG tablet Take 1 tablet (12.5 mg total) by mouth 3 (three) times daily as needed for dizziness. 30 tablet 1  . ondansetron (ZOFRAN) 4 MG tablet Take 1 tablet (4 mg total) by mouth every 8 (eight) hours as needed for nausea or vomiting. 20 tablet 0  . ONETOUCH DELICA LANCETS 95G MISC Use to check blood sugar 2 times per day dx code E11.9 100 each 3  . ONETOUCH VERIO test strip USE AS INSTRUCTED TO CHECK BLOOD SUGAR 2 TIMES PER DAY DX CODE E11.9 100 each 3   No current facility-administered medications on file prior to visit.     BP 122/78 (BP Location: Right Arm, Patient Position: Sitting, Cuff Size: Large)   Pulse 80   Temp 98.3  F (36.8 C) (Oral)   Wt 218 lb (98.9 kg)   LMP 03/15/2013   BMI 36.28 kg/m      Review of Systems  Constitutional: Negative.   HENT: Positive for congestion and postnasal drip. Negative for dental problem, hearing loss, rhinorrhea, sinus pressure, sore throat and tinnitus.   Eyes: Negative for pain, discharge and visual disturbance.  Respiratory: Positive for cough. Negative for shortness of breath.   Cardiovascular: Negative for chest pain, palpitations and leg swelling.  Gastrointestinal: Negative for abdominal distention, abdominal pain, blood in stool, constipation, diarrhea, nausea and vomiting.  Genitourinary: Negative for difficulty urinating, dysuria, flank pain, frequency, hematuria, pelvic pain, urgency, vaginal bleeding, vaginal discharge and vaginal pain.  Musculoskeletal: Negative for arthralgias, gait problem and joint swelling.  Skin: Negative for rash.  Neurological: Positive for dizziness. Negative for syncope, speech difficulty, weakness, numbness and headaches.  Hematological: Negative for adenopathy.  Psychiatric/Behavioral: Negative for agitation, behavioral problems and dysphoric mood. The patient is not nervous/anxious.        Objective:   Physical Exam  Constitutional: She is oriented to person, place, and time. She appears well-developed and well-nourished.  Blood pressure well controlled Weight 218  HENT:  Head: Normocephalic.  Right Ear: External ear normal.  Left Ear: External ear normal.  Mouth/Throat: Oropharynx is clear and moist.  Wax right canal  Eyes: Conjunctivae and EOM are normal. Pupils are equal, round, and reactive to light.  Neck: Normal range of motion. Neck supple. No thyromegaly present.  Cardiovascular: Normal rate, regular rhythm, normal heart sounds and intact distal pulses.  Pulmonary/Chest: Effort normal and breath sounds normal. No respiratory distress. She has no wheezes. She has no rales.  Abdominal: Soft. Bowel sounds are  normal. She exhibits no mass. There is no tenderness.  Musculoskeletal: Normal range of motion.  Lymphadenopathy:    She has no cervical adenopathy.  Neurological: She is alert and oriented to person, place, and time.  Skin: Skin is warm and dry. No rash noted.  Psychiatric: She has a normal mood and affect. Her behavior is normal.          Assessment & Plan:   Diabetes mellitus.  Hemoglobin A1c today 7.2.  Patient is on a submaximal dose of Victoza.  Suggested increased 1.8 mg daily Essential hypertension stable Obesity.  Weight loss encouraged Dyslipidemia continue statin therapy Status post URI resolved  Follow-up 6 months Endocrinology follow-up as scheduled  Nyoka Cowden

## 2017-09-27 ENCOUNTER — Other Ambulatory Visit: Payer: Self-pay | Admitting: Internal Medicine

## 2017-09-28 ENCOUNTER — Other Ambulatory Visit: Payer: Self-pay | Admitting: Family Medicine

## 2017-09-28 DIAGNOSIS — R0982 Postnasal drip: Secondary | ICD-10-CM

## 2017-10-27 ENCOUNTER — Other Ambulatory Visit: Payer: Self-pay | Admitting: Internal Medicine

## 2017-12-01 ENCOUNTER — Other Ambulatory Visit: Payer: Self-pay | Admitting: Internal Medicine

## 2017-12-01 DIAGNOSIS — Z1231 Encounter for screening mammogram for malignant neoplasm of breast: Secondary | ICD-10-CM

## 2017-12-02 ENCOUNTER — Telehealth: Payer: Self-pay | Admitting: Family Medicine

## 2017-12-02 NOTE — Telephone Encounter (Signed)
ok 

## 2017-12-02 NOTE — Telephone Encounter (Signed)
Copied from [place] [id number]. Topic: Appointment Scheduling - Scheduling Inquiry for Clinic >> Dec 01, 2017  3:34 PM Synthia Innocent wrote: Reason for CRM: Requesting to see if Dr Elease Hashimoto would be willing to accept her after Dr Shan Levans. Has seen him before and requested that I ask. Please advise

## 2017-12-03 NOTE — Telephone Encounter (Signed)
I left a voice message on cell with below information. Patient can call the office back is she needs to schedule appointment with Dr. Elease Hashimoto.

## 2017-12-04 ENCOUNTER — Ambulatory Visit
Admission: RE | Admit: 2017-12-04 | Discharge: 2017-12-04 | Disposition: A | Discharge status: Home / Self Care | Payer: 59 | Source: Ambulatory Visit | Attending: Internal Medicine | Admitting: Internal Medicine

## 2017-12-04 DIAGNOSIS — Z1231 Encounter for screening mammogram for malignant neoplasm of breast: Secondary | ICD-10-CM

## 2017-12-05 ENCOUNTER — Other Ambulatory Visit: Payer: Self-pay | Admitting: Internal Medicine

## 2017-12-05 DIAGNOSIS — M25512 Pain in left shoulder: Secondary | ICD-10-CM | POA: Insufficient documentation

## 2017-12-24 ENCOUNTER — Encounter: Payer: Self-pay | Admitting: Internal Medicine

## 2017-12-24 ENCOUNTER — Ambulatory Visit (INDEPENDENT_AMBULATORY_CARE_PROVIDER_SITE_OTHER): Payer: 59 | Admitting: Internal Medicine

## 2017-12-24 VITALS — BP 122/70 | HR 74 | Temp 97.7°F | Wt 214.0 lb

## 2017-12-24 DIAGNOSIS — Z0000 Encounter for general adult medical examination without abnormal findings: Secondary | ICD-10-CM

## 2017-12-24 LAB — COMPREHENSIVE METABOLIC PANEL
ALK PHOS: 65 U/L (ref 39–117)
ALT: 55 U/L — ABNORMAL HIGH (ref 0–35)
AST: 29 U/L (ref 0–37)
Albumin: 4.4 g/dL (ref 3.5–5.2)
BUN: 12 mg/dL (ref 6–23)
CALCIUM: 9.4 mg/dL (ref 8.4–10.5)
CHLORIDE: 103 meq/L (ref 96–112)
CO2: 30 mEq/L (ref 19–32)
CREATININE: 0.69 mg/dL (ref 0.40–1.20)
GFR: 114.62 mL/min (ref >60.00)
Glucose, Bld: 173 mg/dL — ABNORMAL HIGH (ref 70–99)
Potassium: 4.4 mEq/L (ref 3.5–5.1)
SODIUM: 139 meq/L (ref 135–145)
TOTAL PROTEIN: 7 g/dL (ref 6.0–8.3)
Total Bilirubin: 0.5 mg/dL (ref 0.2–1.2)

## 2017-12-24 LAB — MICROALBUMIN / CREATININE URINE RATIO
Creatinine,U: 217.6 mg/dL
MICROALB UR: 1.5 mg/dL (ref 0.0–1.9)
Microalb Creat Ratio: 0.7 mg/g (ref 0.0–30.0)

## 2017-12-24 LAB — CBC WITH DIFFERENTIAL/PLATELET
BASOS PCT: 0.6 % (ref 0.0–3.0)
Basophils Absolute: 0 10*3/uL (ref 0.0–0.1)
EOS ABS: 0.1 10*3/uL (ref 0.0–0.7)
Eosinophils Relative: 1.3 % (ref 0.0–5.0)
HEMATOCRIT: 38.7 % (ref 36.0–46.0)
HEMOGLOBIN: 12.9 g/dL (ref 12.0–15.0)
LYMPHS PCT: 51.7 % — AB (ref 12.0–46.0)
Lymphs Abs: 2.5 10*3/uL (ref 0.7–4.0)
MCHC: 33.4 g/dL (ref 30.0–36.0)
MCV: 92.5 fl (ref 78.0–100.0)
MONO ABS: 0.4 10*3/uL (ref 0.1–1.0)
Monocytes Relative: 7.7 % (ref 3.0–12.0)
Neutro Abs: 1.9 10*3/uL (ref 1.4–7.7)
Neutrophils Relative %: 38.7 % — ABNORMAL LOW (ref 43.0–77.0)
Platelets: 258 10*3/uL (ref 150.0–400.0)
RBC: 4.19 Mil/uL (ref 3.87–5.11)
RDW: 13.3 % (ref 11.5–15.5)
WBC: 4.8 10*3/uL (ref 4.0–10.5)

## 2017-12-24 LAB — LIPID PANEL
CHOLESTEROL: 239 mg/dL — AB (ref 0–200)
HDL: 70.9 mg/dL (ref >39.00)
LDL CALC: 156 mg/dL — AB (ref 0–99)
NonHDL: 168.55
TRIGLYCERIDES: 65 mg/dL (ref 0.0–149.0)
Total CHOL/HDL Ratio: 3
VLDL: 13 mg/dL (ref 0.0–40.0)

## 2017-12-24 LAB — TSH: TSH: 0.95 u[IU]/mL (ref 0.35–4.50)

## 2017-12-24 NOTE — Patient Instructions (Signed)
Limit your sodium (Salt) intake    It is important that you exercise regularly, at least 20 minutes 3 to 4 times per week.  If you develop chest pain or shortness of breath seek  medical attention.   Please check your hemoglobin A1c every 3 months  Please check your blood pressure on a regular basis.  If it is consistently greater than 140/90, please make an office appointment.

## 2017-12-24 NOTE — Progress Notes (Signed)
Subjective:    Patient ID: Carla Williams, female    DOB: 01/09/1966, 52 y.o.   MRN: [id number]  HPI  Lab Results  Component Value Date   HGBA1C 7.2 09/08/2017   52 year old patient who is seen today for a preventive health examination.  She has type 2 diabetes and is followed by endocrinology.  She is scheduled for follow-up next month.  She also has a OB/GYN visit scheduled for next month. She has essential hypertension.  She has dyslipidemia and remains on statin therapy. She no longer is on Invokamet due to lack of insurance coverage.  She presently is on metformin only she had a recent mammogram earlier this month and is up-to-date on screening colonoscopies.  Family history Father age 82 status post CABG.  Probably had his first MI in his late 22s history of diabetes dyslipidemia and hypertension. Mother is 53 with diabetes and hypertension.  Also has a history of a cerebral aneurysm.  No other family members with a history of cerebral aneurysm also a history of COPD One brother one sister remain well  Past Medical History:  Diagnosis Date  . Colon polyps    hyperplastic  . Diabetes mellitus   . GERD (gastroesophageal reflux disease)   . Hyperlipemia   . Hypertension   . Obesity      Social History   Socioeconomic History  . Marital status: Married    Spouse name: Not on file  . Number of children: 2  . Years of education: Not on file  . Highest education level: Not on file  Occupational History  . Occupation: Journalist, newspaper  . Financial resource strain: Not on file  . Food insecurity:    Worry: Not on file    Inability: Not on file  . Transportation needs:    Medical: Not on file    Non-medical: Not on file  Tobacco Use  . Smoking status: Never Smoker  . Smokeless tobacco: Never Used  Substance and Sexual Activity  . Alcohol use: No  . Drug use: No  . Sexual activity: Not on file  Lifestyle  . Physical activity:    Days per week: Not on  file    Minutes per session: Not on file  . Stress: Not on file  Relationships  . Social connections:    Talks on phone: Not on file    Gets together: Not on file    Attends religious service: Not on file    Active member of club or organization: Not on file    Attends meetings of clubs or organizations: Not on file    Relationship status: Not on file  . Intimate partner violence:    Fear of current or ex partner: Not on file    Emotionally abused: Not on file    Physically abused: Not on file    Forced sexual activity: Not on file  Other Topics Concern  . Not on file  Social History Narrative   One caffeine drink daily     Past Surgical History:  Procedure Laterality Date  . TUBAL LIGATION      Family History  Problem Relation Age of Onset  . Breast cancer Unknown        Maternal Great Aunt   . Diabetes Father        and Mother  . Heart disease Father   . Colon cancer Neg Hx   . Stomach cancer Neg Hx   . Esophageal  cancer Neg Hx     No Known Allergies  Current Outpatient Medications on File Prior to Visit  Medication Sig Dispense Refill  . atorvastatin (LIPITOR) 40 MG tablet TAKE 1 TABLET (40 MG TOTAL) BY MOUTH DAILY. ONE TABLET BY MOUTH ONCE DAILY 90 tablet 0  . benazepril-hydrochlorthiazide (LOTENSIN HCT) 10-12.5 MG tablet TAKE 1 TABLET BY MOUTH DAILY. 30 tablet 0  . dextromethorphan (DELSYM) 30 MG/5ML liquid Take 10 mLs (60 mg total) by mouth as needed for cough. 89 mL 0  . escitalopram (LEXAPRO) 10 MG tablet TAKE 1 TABLET BY MOUTH EVERY DAY 90 tablet 0  . esomeprazole (NEXIUM) 40 MG capsule TAKE 1 CAPSULE BY MOUTH DAILY BEFORE BREAKFAST. AS NEEDED 90 capsule 0  . fluticasone (FLONASE) 50 MCG/ACT nasal spray PLACE 1 SPRAY INTO BOTH NOSTRILS DAILY. 16 g 2  . glimepiride (AMARYL) 4 MG tablet Take 1 tablet (4 mg total) by mouth daily before breakfast. 30 tablet 3  . ibuprofen (ADVIL,MOTRIN) 800 MG tablet Take 1 tablet (800 mg total) by mouth as needed. 60 tablet 1    . Insulin Pen Needle (NOVOTWIST) 32G X 5 MM MISC Use one per day to inject Victoza 50 each 3  . liraglutide (VICTOZA) 18 MG/3ML SOPN Inject 0.1 mLs (0.6 mg total) into the skin daily. Inject 1.2 mg daily 2 pen 3  . liraglutide (VICTOZA) 18 MG/3ML SOPN Victoza 2-Pak 0.6 mg/0.1 mL (18 mg/3 mL) subcutaneous pen injector  INJECT 1.8 MCG ONCE A DAY SUBCUTANEOUSLY    . meclizine (ANTIVERT) 12.5 MG tablet Take 1 tablet (12.5 mg total) by mouth 3 (three) times daily as needed for dizziness. 30 tablet 1  . metFORMIN (GLUCOPHAGE-XR) 500 MG 24 hr tablet Take 500 mg by mouth 2 (two) times daily.  3  . ondansetron (ZOFRAN) 4 MG tablet Take 1 tablet (4 mg total) by mouth every 8 (eight) hours as needed for nausea or vomiting. 20 tablet 0  . ONETOUCH DELICA LANCETS 13K MISC Use to check blood sugar 2 times per day dx code E11.9 100 each 3  . ONETOUCH VERIO test strip USE AS INSTRUCTED TO CHECK BLOOD SUGAR 2 TIMES PER DAY DX CODE E11.9 100 each 3   No current facility-administered medications on file prior to visit.     BP 122/70 (BP Location: Right Arm, Patient Position: Sitting, Cuff Size: Large)   Pulse 74   Temp 97.7 F (36.5 C) (Oral)   Wt 214 lb (97.1 kg)   LMP 03/15/2013   SpO2 97%   BMI 35.61 kg/m    Review of Systems  Constitutional: Negative.   HENT: Negative for congestion, dental problem, hearing loss, rhinorrhea, sinus pressure, sore throat and tinnitus.   Eyes: Negative for pain, discharge and visual disturbance.  Respiratory: Negative for cough and shortness of breath.   Cardiovascular: Negative for chest pain, palpitations and leg swelling.  Gastrointestinal: Negative for abdominal distention, abdominal pain, blood in stool, constipation, diarrhea, nausea and vomiting.  Genitourinary: Negative for difficulty urinating, dysuria, flank pain, frequency, hematuria, pelvic pain, urgency, vaginal bleeding, vaginal discharge and vaginal pain.  Musculoskeletal: Negative for arthralgias,  gait problem and joint swelling.  Skin: Negative for rash.  Neurological: Negative for dizziness, syncope, speech difficulty, weakness, numbness and headaches.  Hematological: Negative for adenopathy.  Psychiatric/Behavioral: Negative for agitation, behavioral problems and dysphoric mood. The patient is not nervous/anxious.        Objective:   Physical Exam  Constitutional: She is oriented to person, place, and time.  She appears well-developed and well-nourished.  Weight 214  HENT:  Head: Normocephalic.  Right Ear: External ear normal.  Left Ear: External ear normal.  Mouth/Throat: Oropharynx is clear and moist.  Eyes: Pupils are equal, round, and reactive to light. Conjunctivae and EOM are normal.  Neck: Normal range of motion. Neck supple. No thyromegaly present.  Cardiovascular: Normal rate, regular rhythm, normal heart sounds and intact distal pulses.  Pulmonary/Chest: Effort normal and breath sounds normal.  Abdominal: Soft. Bowel sounds are normal. She exhibits no mass. There is no tenderness.  Musculoskeletal: Normal range of motion.  Lymphadenopathy:    She has no cervical adenopathy.  Neurological: She is alert and oriented to person, place, and time.  Skin: Skin is warm and dry. No rash noted.  Psychiatric: She has a normal mood and affect. Her behavior is normal.          Assessment & Plan:   Preventive health examination Diabetes mellitus.  Endocrinology follow-up scheduled for next month Essential hypertension well-controlled Dyslipidemia continue statin therapy Obesity.  Weight loss encouraged The patient will follow up with a new provider in 6 months   Endocrinology and OB/GYN consultations as scheduled  Marletta Lor

## 2018-01-25 ENCOUNTER — Other Ambulatory Visit: Payer: Self-pay | Admitting: Internal Medicine

## 2018-02-09 ENCOUNTER — Other Ambulatory Visit: Payer: Self-pay | Admitting: Internal Medicine

## 2018-02-09 NOTE — Telephone Encounter (Signed)
Okay for refill for 90 days? Please advise

## 2018-03-11 ENCOUNTER — Ambulatory Visit: Payer: 59 | Admitting: Internal Medicine

## 2018-04-22 ENCOUNTER — Other Ambulatory Visit: Payer: Self-pay | Admitting: Internal Medicine

## 2018-05-23 ENCOUNTER — Ambulatory Visit (HOSPITAL_COMMUNITY)
Admission: EM | Admit: 2018-05-23 | Discharge: 2018-05-23 | Disposition: A | Discharge status: Home / Self Care | Payer: Managed Care, Other (non HMO) | Attending: Family Medicine | Admitting: Family Medicine

## 2018-05-23 ENCOUNTER — Encounter (HOSPITAL_COMMUNITY): Payer: Self-pay

## 2018-05-23 DIAGNOSIS — L0291 Cutaneous abscess, unspecified: Secondary | ICD-10-CM | POA: Diagnosis not present

## 2018-05-23 MED ORDER — SULFAMETHOXAZOLE-TRIMETHOPRIM 800-160 MG PO TABS: 1.0000 | ORAL_TABLET | Freq: Two times a day (BID) | ORAL | 0 refills | Status: AC

## 2018-05-23 MED ORDER — MUPIROCIN 2 % EX OINT: TOPICAL_OINTMENT | CUTANEOUS | 1 refills | Status: DC

## 2018-05-23 MED ORDER — LIDOCAINE HCL (PF) 1 % IJ SOLN
INTRAMUSCULAR | Status: AC
  Filled 2018-05-23: qty 30

## 2018-05-23 NOTE — ED Triage Notes (Signed)
Pt presents with abscess on left inner groin area.

## 2018-05-23 NOTE — ED Provider Notes (Signed)
Patient: Carla Williams MRN: [id number] DOB: Nov 14, 1965 PCP: Marletta Lor, MD     Subjective:  Chief Complaint  Patient presents with  . Abscess    HPI: The patient is a 53 y.o. female who presents today for abscess to her left inner thigh. She has hx of diabetes, HTN, hyperlipidemia and GERD. Last A1c was 7.2 in August. She first noticed this abscess about one week ago. She normally puts camphphinique on them and they go away. She wore jeans yesterday and noticed it had swollen. When she got hom from work it had a head that looked pussy. She poked it and nothing came out. She states the swelling was worse and it throbbed all night. No fever/chills. She does think it may have ruptured on her way here as she has some discharge on her panties.   Review of Systems  Constitutional: Negative for chills and fever.  Respiratory: Negative for cough and shortness of breath.   Cardiovascular: Negative for chest pain and palpitations.  Gastrointestinal: Negative for abdominal pain, nausea and vomiting.  Genitourinary: Negative for dysuria.  Skin:       +abscess to her left inner leg with redness and swelling   Neurological: Positive for headaches.    Allergies Patient has No Known Allergies.  Past Medical History Patient  has a past medical history of Colon polyps, Diabetes mellitus, GERD (gastroesophageal reflux disease), Hyperlipemia, Hypertension, and Obesity.  Surgical History Patient  has a past surgical history that includes Tubal ligation.  Family History Pateint's family history includes Breast cancer in her unknown relative; Diabetes in her father; Heart disease in her father.  Social History Patient  reports that she has never smoked. She has never used smokeless tobacco. She reports that she does not drink alcohol or use drugs.    Objective: Vitals:   05/23/18 1220  BP: (!) 133/91  Pulse: 83  Resp: 20  Temp: 99.1 F (37.3 C)  TempSrc: Oral  SpO2: 97%     There is no height or weight on file to calculate BMI.  Physical Exam  Constitutional: She appears well-developed and well-nourished.  Cardiovascular: Normal rate, regular rhythm and normal heart sounds.  Pulmonary/Chest: Effort normal and breath sounds normal.  Abdominal: Soft. Bowel sounds are normal.  Skin:  Large, indurated abscess on left inner thigh/groin area. Erythematous and edematous. Induration about 5cmx5cm. Minimal fluctuance. Small opening with drainage.   Vitals reviewed.  Procedure note Verbal consent obtained. Area cleaned with alcohol. 1% lidocaine used for local anesthetic. 10 blade scalpel used to open abscess. Blood and purulent drainage. Patient tolerated procedure well. Covered with guaze and tape applied. Wound care discussed.      Assessment/plan:   ICD-10-CM   1. Abscess L02.91    -I&D done. Discussed drainage is curative. Warm compresses TID and course of bactrim with hx of diabetes. (renal fx wnl)  If fever/worsening symptoms needs to go to ER. Can also f/u with PCP if feels not getting better, but no fever or acutely worsening symptoms.     Orma Flaming, MD  05/23/2018    Orma Flaming, MD 05/23/18 1349

## 2018-05-23 NOTE — Discharge Instructions (Signed)
Giving you bactrim DS to take twice  a day for 10 days Warm compresses TID Tylenol/ibuprofen for pain Drink lots of fluids Fever, worsening symptoms::: ER

## 2018-06-18 ENCOUNTER — Other Ambulatory Visit: Payer: Self-pay | Admitting: Internal Medicine

## 2018-06-26 ENCOUNTER — Encounter: Payer: Self-pay | Admitting: Family Medicine

## 2018-06-26 ENCOUNTER — Other Ambulatory Visit: Payer: Self-pay

## 2018-06-26 ENCOUNTER — Telehealth: Payer: Self-pay | Admitting: Family Medicine

## 2018-06-26 ENCOUNTER — Ambulatory Visit: Payer: Managed Care, Other (non HMO) | Admitting: Family Medicine

## 2018-06-26 VITALS — BP 124/82 | HR 68 | Temp 97.9°F | Ht 65.0 in | Wt 201.6 lb

## 2018-06-26 DIAGNOSIS — K219 Gastro-esophageal reflux disease without esophagitis: Secondary | ICD-10-CM | POA: Diagnosis not present

## 2018-06-26 DIAGNOSIS — I10 Essential (primary) hypertension: Secondary | ICD-10-CM | POA: Diagnosis not present

## 2018-06-26 DIAGNOSIS — E785 Hyperlipidemia, unspecified: Secondary | ICD-10-CM

## 2018-06-26 DIAGNOSIS — Z23 Encounter for immunization: Secondary | ICD-10-CM

## 2018-06-26 DIAGNOSIS — E119 Type 2 diabetes mellitus without complications: Secondary | ICD-10-CM

## 2018-06-26 LAB — HEPATIC FUNCTION PANEL
ALT: 65 U/L — AB (ref 0–35)
AST: 37 U/L (ref 0–37)
Albumin: 4.5 g/dL (ref 3.5–5.2)
Alkaline Phosphatase: 70 U/L (ref 39–117)
BILIRUBIN DIRECT: 0.1 mg/dL (ref 0.0–0.3)
Total Bilirubin: 0.7 mg/dL (ref 0.2–1.2)
Total Protein: 6.9 g/dL (ref 6.0–8.3)

## 2018-06-26 LAB — LIPID PANEL
Cholesterol: 271 mg/dL — ABNORMAL HIGH (ref 0–200)
HDL: 53.6 mg/dL (ref >39.00)
LDL Cholesterol: 188 mg/dL — ABNORMAL HIGH (ref 0–99)
NONHDL: 216.99
TRIGLYCERIDES: 146 mg/dL (ref 0.0–149.0)
Total CHOL/HDL Ratio: 5
VLDL: 29.2 mg/dL (ref 0.0–40.0)

## 2018-06-26 LAB — BASIC METABOLIC PANEL
BUN: 15 mg/dL (ref 6–23)
CHLORIDE: 101 meq/L (ref 96–112)
CO2: 27 meq/L (ref 19–32)
Calcium: 9.4 mg/dL (ref 8.4–10.5)
Creatinine, Ser: 0.68 mg/dL (ref 0.40–1.20)
GFR: 116.35 mL/min (ref >60.00)
Glucose, Bld: 302 mg/dL — ABNORMAL HIGH (ref 70–99)
POTASSIUM: 4.5 meq/L (ref 3.5–5.1)
Sodium: 137 mEq/L (ref 135–145)

## 2018-06-26 NOTE — Addendum Note (Signed)
Addended by: Sheffield Slider L on: 06/26/2018 10:09 AM   Modules accepted: Orders

## 2018-06-26 NOTE — Telephone Encounter (Signed)
I changed provider for patient per Dr. Elease Hashimoto.  From Dr. Elease Hashimoto: yes  i agreed last summer.  have them switch over to me in computer. Make sure you document in phone note and you can change. Thank you!!

## 2018-06-26 NOTE — Progress Notes (Signed)
  Subjective:     Patient ID: Carla Williams, female   DOB: 30-Jul-1965, 52 y.o.   MRN: [id number]  HPI Patient for medical follow-up.  She has history of type 2 diabetes, hyperlipidemia, hypertension.  She is followed by endocrinology.  She states she was diagnosed with diabetes around 2007.  Medications reviewed.  Her blood pressures been well controlled.  She gets regular eye exams.  She has hyperlipidemia treated with Lipitor.  Her lipids were checked last summer and elevated but at that point she was not taking her Lipitor regularly.  She is taking this consistently now.  No myalgias.  No recent exertional chest pains.  She has occasional reflux symptoms which are usually controlled with over-the-counter medications.  Past Medical History:  Diagnosis Date  . Colon polyps    hyperplastic  . Diabetes mellitus   . GERD (gastroesophageal reflux disease)   . Hyperlipemia   . Hypertension   . Obesity    Past Surgical History:  Procedure Laterality Date  . TUBAL LIGATION      reports that she has never smoked. She has never used smokeless tobacco. She reports that she does not drink alcohol or use drugs. family history includes Breast cancer in an other family member; Diabetes in her father; Heart disease in her father. No Known Allergies   Review of Systems  Constitutional: Negative for fatigue and unexpected weight change.  Eyes: Negative for visual disturbance.  Respiratory: Negative for cough, chest tightness, shortness of breath and wheezing.   Cardiovascular: Negative for chest pain, palpitations and leg swelling.  Endocrine: Negative for polydipsia and polyuria.  Neurological: Negative for dizziness, seizures, syncope, weakness, light-headedness and headaches.       Objective:   Physical Exam Constitutional:      Appearance: She is well-developed.  Eyes:     Pupils: Pupils are equal, round, and reactive to light.  Neck:     Musculoskeletal: Neck supple.     Thyroid:  No thyromegaly.     Vascular: No JVD.  Cardiovascular:     Rate and Rhythm: Normal rate and regular rhythm.     Heart sounds: No gallop.   Pulmonary:     Effort: Pulmonary effort is normal. No respiratory distress.     Breath sounds: Normal breath sounds. No wheezing or rales.  Neurological:     Mental Status: She is alert.        Assessment:     #1 hypertension stable and at goal  #2 dyslipidemia  #3 type 2 diabetes followed by endocrinology  #4 intermittent GERD controlled with over-the-counter medications    Plan:     -Flu vaccine given -Check labs with lipid panel, basic metabolic panel, hepatic panel -Routine follow-up in 6 months and sooner as needed  Eulas Post MD [facility]

## 2018-06-30 ENCOUNTER — Encounter: Payer: Self-pay | Admitting: Family Medicine

## 2018-06-30 ENCOUNTER — Ambulatory Visit (INDEPENDENT_AMBULATORY_CARE_PROVIDER_SITE_OTHER): Payer: Managed Care, Other (non HMO) | Admitting: Family Medicine

## 2018-06-30 VITALS — BP 138/90 | HR 75 | Temp 99.1°F | Ht 65.0 in | Wt 200.9 lb

## 2018-06-30 DIAGNOSIS — R52 Pain, unspecified: Secondary | ICD-10-CM | POA: Diagnosis not present

## 2018-06-30 DIAGNOSIS — J029 Acute pharyngitis, unspecified: Secondary | ICD-10-CM

## 2018-06-30 LAB — POCT INFLUENZA A/B
Influenza A, POC: NEGATIVE
Influenza B, POC: NEGATIVE

## 2018-06-30 LAB — POCT RAPID STREP A (OFFICE): RAPID STREP A SCREEN: NEGATIVE

## 2018-06-30 MED ORDER — OSELTAMIVIR PHOSPHATE 75 MG PO CAPS: 75.0000 mg | ORAL_CAPSULE | Freq: Two times a day (BID) | ORAL | 0 refills | Status: DC

## 2018-06-30 NOTE — Progress Notes (Signed)
Carla Williams - 52 y.o. female MRN [id number]  Date of birth: [birth year]  SUBJECTIVE:  Including CC & ROS.  Chief Complaint  Patient presents with  . Cough    w/ sore throat and body aches    Carla Williams is a 52 y.o. female that is presenting with sore throat body aches and malaise.  Her symptoms been ongoing for a couple of days.  Has been around her grandchildren with similar symptoms.  Feels like her symptoms are getting worse.  Symptoms are moderate to severe..   Review of Systems  Constitutional: Positive for activity change and chills.  HENT: Positive for sore throat.   Respiratory: Positive for cough.   Cardiovascular: Negative for chest pain.  Gastrointestinal: Negative for abdominal pain.    HISTORY: Past Medical, Surgical, Social, and Family History Reviewed & Updated per EMR.   Pertinent Historical Findings include:  Past Medical History:  Diagnosis Date  . Colon polyps    hyperplastic  . Diabetes mellitus   . GERD (gastroesophageal reflux disease)   . Hyperlipemia   . Hypertension   . Obesity     Past Surgical History:  Procedure Laterality Date  . TUBAL LIGATION      No Known Allergies  Family History  Problem Relation Age of Onset  . Breast cancer Other        Maternal Great Aunt   . Diabetes Father        and Mother  . Heart disease Father   . Colon cancer Neg Hx   . Stomach cancer Neg Hx   . Esophageal cancer Neg Hx      Social History   Socioeconomic History  . Marital status: Married    Spouse name: Not on file  . Number of children: 2  . Years of education: Not on file  . Highest education level: Not on file  Occupational History  . Occupation: Journalist, newspaper  . Financial resource strain: Not on file  . Food insecurity:    Worry: Not on file    Inability: Not on file  . Transportation needs:    Medical: Not on file    Non-medical: Not on file  Tobacco Use  . Smoking status: Never Smoker  . Smokeless  tobacco: Never Used  Substance and Sexual Activity  . Alcohol use: No  . Drug use: No  . Sexual activity: Not on file  Lifestyle  . Physical activity:    Days per week: Not on file    Minutes per session: Not on file  . Stress: Not on file  Relationships  . Social connections:    Talks on phone: Not on file    Gets together: Not on file    Attends religious service: Not on file    Active member of club or organization: Not on file    Attends meetings of clubs or organizations: Not on file    Relationship status: Not on file  . Intimate partner violence:    Fear of current or ex partner: Not on file    Emotionally abused: Not on file    Physically abused: Not on file    Forced sexual activity: Not on file  Other Topics Concern  . Not on file  Social History Narrative   One caffeine drink daily      PHYSICAL EXAM:  VS: BP 138/90   Pulse 75   Temp 99.1 F (37.3 C) (Oral)   Ht 5'  5" (1.651 m)   Wt 200 lb 14.1 oz (91.1 kg)   LMP 03/15/2013   SpO2 98%   BMI 33.43 kg/m  Physical Exam Gen: NAD, alert, cooperative with exam, ENT: normal lips, normal nasal mucosa, tympanic membranes clear and intact bilaterally, normal oropharynx, no cervical lymphadenopathy Eye: normal EOM, normal conjunctiva and lids CV:  no edema, +2 pedal pulses, regular rate and rhythm, S1-S2   Resp: no accessory muscle use, non-labored, clear to auscultation bilaterally, no crackles or wheezes Skin: no rashes, no areas of induration  Neuro: normal tone, normal sensation to touch Psych:  normal insight, alert and oriented MSK: Normal gait, normal strength       ASSESSMENT & PLAN:   Body aches Rapid strep was negative flu was negative as well.  Symptoms could still be suggestive of influenza. -Provided Tamiflu. -Counseled on supportive care. -Given indications to follow-up and return.

## 2018-06-30 NOTE — Patient Instructions (Addendum)
Nice to meet you  Please alternate ibuprofen and tylenol  Please try honey, vick's vapor rub, lozenges and humidifer for cough and sore throat  Happy new Year

## 2018-07-02 DIAGNOSIS — R52 Pain, unspecified: Secondary | ICD-10-CM | POA: Insufficient documentation

## 2018-07-02 NOTE — Assessment & Plan Note (Signed)
Rapid strep was negative flu was negative as well.  Symptoms could still be suggestive of influenza. -Provided Tamiflu. -Counseled on supportive care. -Given indications to follow-up and return.

## 2018-07-07 ENCOUNTER — Other Ambulatory Visit: Payer: Self-pay

## 2018-07-07 DIAGNOSIS — E7800 Pure hypercholesterolemia, unspecified: Secondary | ICD-10-CM

## 2018-09-02 ENCOUNTER — Other Ambulatory Visit: Payer: Self-pay

## 2018-09-02 MED ORDER — ESOMEPRAZOLE MAGNESIUM 40 MG PO CPDR: DELAYED_RELEASE_CAPSULE | ORAL | 2 refills | Status: DC

## 2018-09-15 ENCOUNTER — Other Ambulatory Visit: Payer: Self-pay | Admitting: Family Medicine

## 2018-09-15 DIAGNOSIS — E785 Hyperlipidemia, unspecified: Secondary | ICD-10-CM

## 2018-09-16 NOTE — Telephone Encounter (Signed)
Rx done and lab order entered.

## 2018-09-16 NOTE — Telephone Encounter (Signed)
Refill for 3 months and will need lipid panel by then before further refills.

## 2018-09-21 ENCOUNTER — Telehealth: Payer: Self-pay | Admitting: Family Medicine

## 2018-09-21 ENCOUNTER — Other Ambulatory Visit: Payer: Self-pay

## 2018-09-21 MED ORDER — BENAZEPRIL-HYDROCHLOROTHIAZIDE 10-12.5 MG PO TABS: 1.0000 | ORAL_TABLET | Freq: Every day | ORAL | 0 refills | Status: DC

## 2018-09-21 NOTE — Telephone Encounter (Signed)
Medication has been refilled and sent to pharmacy requested. 

## 2018-09-21 NOTE — Telephone Encounter (Signed)
Copied from [facility] [id number]. Topic: Quick Communication - Rx Refill/Question >> Sep 21, 2018  2:29 PM Gustavus Messing wrote: Medication: benazepril-hydrochlorthiazide (LOTENSIN HCT) 10-12.5 MG tablet   Has the patient contacted their pharmacy? No. (Agent: If no, request that the patient contact the pharmacy for the refill.) Patient does not have any refills   Preferred Pharmacy (with phone number or street name): [id number] [address], [place]. [phone] (Phone) [phone] (Fax)   Agent: Please be advised that RX refills may take up to 3 business days. We ask that you follow-up with your pharmacy.

## 2018-12-15 ENCOUNTER — Other Ambulatory Visit: Payer: Self-pay | Admitting: Family Medicine

## 2018-12-15 DIAGNOSIS — Z1231 Encounter for screening mammogram for malignant neoplasm of breast: Secondary | ICD-10-CM

## 2018-12-28 ENCOUNTER — Encounter: Payer: Managed Care, Other (non HMO) | Admitting: Family Medicine

## 2019-01-04 ENCOUNTER — Ambulatory Visit (INDEPENDENT_AMBULATORY_CARE_PROVIDER_SITE_OTHER): Payer: Managed Care, Other (non HMO) | Admitting: Family Medicine

## 2019-01-04 ENCOUNTER — Telehealth: Payer: Self-pay | Admitting: Family Medicine

## 2019-01-04 ENCOUNTER — Encounter: Payer: Self-pay | Admitting: Family Medicine

## 2019-01-04 ENCOUNTER — Other Ambulatory Visit: Payer: Self-pay

## 2019-01-04 DIAGNOSIS — R0989 Other specified symptoms and signs involving the circulatory and respiratory systems: Secondary | ICD-10-CM | POA: Diagnosis not present

## 2019-01-04 NOTE — Progress Notes (Signed)
Patient ID: Carla Williams, female   DOB: 05/13/1966, 53 y.o.   MRN: [id number]   This visit type was conducted due to national recommendations for restrictions regarding the COVID-19 pandemic in an effort to limit this patient's exposure and mitigate transmission in our community.   Virtual Visit via Video Note  I connected with Gagandeep Kossman on 01/04/19 at  1:30 PM EDT by a video enabled telemedicine application and verified that I am speaking with the correct person using two identifiers.  Location patient: home Location provider:work or home office Persons participating in the virtual visit: patient, provider  I discussed the limitations of evaluation and management by telemedicine and the availability of in person appointments. The patient expressed understanding and agreed to proceed.   HPI: Patient is seen with upper respiratory symptoms which started this past Saturday.  [place]  No sick contacts.  By Saturday she developed some nasal congestion, cough, sore throat, sneezing.  No definite fever.  Decreased taste.  One episode of some bloody nasal discharge.  No headaches.  Symptoms were better by Sunday but not completely relieved.  She denies any dyspnea.  Cough has been relatively mild.   ROS: See pertinent positives and negatives per HPI.  Past Medical History:  Diagnosis Date  . Colon polyps    hyperplastic  . Diabetes mellitus   . GERD (gastroesophageal reflux disease)   . Hyperlipemia   . Hypertension   . Obesity     Past Surgical History:  Procedure Laterality Date  . TUBAL LIGATION      Family History  Problem Relation Age of Onset  . Breast cancer Other        Maternal Great Aunt   . Diabetes Father        and Mother  . Heart disease Father   . Colon cancer Neg Hx   . Stomach cancer Neg Hx   . Esophageal cancer Neg Hx     SOCIAL HX: Non-smoker   Current Outpatient Medications:  .  atorvastatin (LIPITOR) 40 MG tablet, TAKE 1  TABLET (40 MG TOTAL) BY MOUTH DAILY. ONE TABLET BY MOUTH ONCE DAILY, Disp: 30 tablet, Rfl: 2 .  benazepril-hydrochlorthiazide (LOTENSIN HCT) 10-12.5 MG tablet, Take 1 tablet by mouth daily., Disp: 90 tablet, Rfl: 0 .  Dapagliflozin-metFORMIN HCl ER (XIGDUO XR) 5-500 MG TB24, Take 1 tablet by mouth 2 (two) times daily., Disp: , Rfl:  .  dextromethorphan (DELSYM) 30 MG/5ML liquid, Take 10 mLs (60 mg total) by mouth as needed for cough., Disp: 89 mL, Rfl: 0 .  escitalopram (LEXAPRO) 10 MG tablet, TAKE 1 TABLET BY MOUTH EVERY DAY, Disp: 90 tablet, Rfl: 0 .  esomeprazole (NEXIUM) 40 MG capsule, TAKE 1 CAPSULE BY MOUTH DAILY BEFORE BREAKFAST. AS NEEDED, Disp: 30 capsule, Rfl: 2 .  fluticasone (FLONASE) 50 MCG/ACT nasal spray, PLACE 1 SPRAY INTO BOTH NOSTRILS DAILY., Disp: 16 g, Rfl: 2 .  glimepiride (AMARYL) 4 MG tablet, Take 1 tablet (4 mg total) by mouth daily before breakfast., Disp: 30 tablet, Rfl: 3 .  ibuprofen (ADVIL,MOTRIN) 800 MG tablet, Take 1 tablet (800 mg total) by mouth as needed., Disp: 60 tablet, Rfl: 1 .  Insulin Pen Needle (NOVOTWIST) 32G X 5 MM MISC, Use one per day to inject Victoza, Disp: 50 each, Rfl: 3 .  liraglutide (VICTOZA) 18 MG/3ML SOPN, Inject 0.1 mLs (0.6 mg total) into the skin daily. Inject 1.2 mg daily, Disp: 2 pen, Rfl: 3 .  liraglutide (VICTOZA) 18 MG/3ML SOPN, Victoza 2-Pak 0.6 mg/0.1 mL (18 mg/3 mL) subcutaneous pen injector  INJECT 1.8 MCG ONCE A DAY SUBCUTANEOUSLY, Disp: , Rfl:  .  metFORMIN (GLUCOPHAGE-XR) 500 MG 24 hr tablet, Take 500 mg by mouth 2 (two) times daily., Disp: , Rfl: 3 .  mupirocin ointment (BACTROBAN) 2 %, Use as needed on open sore up to TID, Disp: 30 g, Rfl: 1 .  ondansetron (ZOFRAN) 4 MG tablet, Take 1 tablet (4 mg total) by mouth every 8 (eight) hours as needed for nausea or vomiting., Disp: 20 tablet, Rfl: 0 .  ONETOUCH DELICA LANCETS 46K MISC, Use to check blood sugar 2 times per day dx code E11.9, Disp: 100 each, Rfl: 3 .  ONETOUCH VERIO test  strip, USE AS INSTRUCTED TO CHECK BLOOD SUGAR 2 TIMES PER DAY DX CODE E11.9, Disp: 100 each, Rfl: 3 .  oseltamivir (TAMIFLU) 75 MG capsule, Take 1 capsule (75 mg total) by mouth 2 (two) times daily., Disp: 10 capsule, Rfl: 0  EXAM:  VITALS per patient if applicable:  GENERAL: alert, oriented, appears well and in no acute distress  HEENT: atraumatic, conjunttiva clear, no obvious abnormalities on inspection of external nose and ears  NECK: normal movements of the head and neck  LUNGS: on inspection no signs of respiratory distress, breathing rate appears normal, no obvious gross SOB, gasping or wheezing  CV: no obvious cyanosis  MS: moves all visible extremities without noticeable abnormality  PSYCH/NEURO: pleasant and cooperative, no obvious depression or anxiety, speech and thought processing grossly intact  ASSESSMENT AND PLAN:  Discussed the following assessment and plan:  Symptoms of upper respiratory infection (URI) -Probably viral.  Rule out COVID-19.  She is in no respiratory distress at this time -We will refer for consideration for COVID-19 testing.  Patient knows to stay quarantined in the meantime    I discussed the assessment and treatment plan with the patient. The patient was provided an opportunity to ask questions and all were answered. The patient agreed with the plan and demonstrated an understanding of the instructions.   The patient was advised to call back or seek an in-person evaluation if the symptoms worsen or if the condition fails to improve as anticipated.   Carolann Littler, MD

## 2019-01-04 NOTE — Telephone Encounter (Signed)
I have scheduled patient today at 1:30pm opening spot but she knows it will be after 4:30apm today. Patient verbalized an understanding.

## 2019-01-04 NOTE — Telephone Encounter (Signed)
Patient is calling to schedule an apt with Dr. Elease Hashimoto for congestion. Please advise Thank you [phone]

## 2019-01-05 ENCOUNTER — Telehealth: Payer: Self-pay | Admitting: *Deleted

## 2019-01-05 DIAGNOSIS — Z20822 Contact with and (suspected) exposure to covid-19: Secondary | ICD-10-CM

## 2019-01-05 NOTE — Telephone Encounter (Signed)
Pt calling back to schedule.

## 2019-01-05 NOTE — Telephone Encounter (Signed)
-----   Message from Anibal Henderson, [place] sent at 01/04/2019  4:41 PM EDT ----- Regarding: COVID 19 Test

## 2019-01-05 NOTE — Telephone Encounter (Signed)
Left voicemail for patient to return call to schedule COVID 19 test.  Order placed.

## 2019-01-05 NOTE — Telephone Encounter (Addendum)
Patient scheduled for 01/06/2019 at [facility] and patient voice understanding

## 2019-01-06 ENCOUNTER — Other Ambulatory Visit: Payer: Managed Care, Other (non HMO)

## 2019-01-06 DIAGNOSIS — Z20822 Contact with and (suspected) exposure to covid-19: Secondary | ICD-10-CM

## 2019-01-07 ENCOUNTER — Ambulatory Visit: Payer: Managed Care, Other (non HMO)

## 2019-01-08 ENCOUNTER — Ambulatory Visit: Payer: Managed Care, Other (non HMO) | Admitting: Family Medicine

## 2019-01-08 ENCOUNTER — Telehealth: Payer: Self-pay

## 2019-01-08 NOTE — Telephone Encounter (Signed)
Please see message.  Copied from [place] [phone]. Topic: General - Other >> Jan 07, 2019  3:27 PM Lennox Solders wrote: Reason for CRM: pt had a virtual visit with dr Elease Hashimoto on 01-04-2019 and she would like a letter from dr burchette stating she needs to quarantine for 14 days. Pt just had covid 19 test 01/06/2019

## 2019-01-09 ENCOUNTER — Other Ambulatory Visit: Payer: Self-pay | Admitting: Family Medicine

## 2019-01-10 ENCOUNTER — Encounter: Payer: Self-pay | Admitting: Family Medicine

## 2019-01-10 NOTE — Telephone Encounter (Signed)
Letter done

## 2019-01-10 NOTE — Progress Notes (Signed)
Letter done.  We will need to keep out of work until results completed.  Obviously, if negative, we may not need to wait the full 14 days.

## 2019-01-11 ENCOUNTER — Other Ambulatory Visit: Payer: Self-pay | Admitting: Family Medicine

## 2019-01-11 NOTE — Telephone Encounter (Signed)
Called patient and she has received the letter through [place] and she is going to print this letter to give to her employer. If they need a signature she will call back and we will leave up front for her to pick up. Patient verbalized an understanding.

## 2019-01-12 LAB — NOVEL CORONAVIRUS, NAA: SARS-CoV-2, NAA: NOT DETECTED

## 2019-01-15 ENCOUNTER — Other Ambulatory Visit: Payer: Self-pay

## 2019-01-15 ENCOUNTER — Ambulatory Visit (INDEPENDENT_AMBULATORY_CARE_PROVIDER_SITE_OTHER): Payer: Managed Care, Other (non HMO) | Admitting: Family Medicine

## 2019-01-15 ENCOUNTER — Encounter: Payer: Self-pay | Admitting: Family Medicine

## 2019-01-15 VITALS — BP 122/84 | HR 78 | Temp 97.8°F | Ht 64.5 in | Wt 200.2 lb

## 2019-01-15 DIAGNOSIS — R5383 Other fatigue: Secondary | ICD-10-CM | POA: Diagnosis not present

## 2019-01-15 DIAGNOSIS — Z0000 Encounter for general adult medical examination without abnormal findings: Secondary | ICD-10-CM | POA: Diagnosis not present

## 2019-01-15 LAB — CBC WITH DIFFERENTIAL/PLATELET
Basophils Absolute: 0.1 10*3/uL (ref 0.0–0.1)
Basophils Relative: 1.3 % (ref 0.0–3.0)
Eosinophils Absolute: 0 10*3/uL (ref 0.0–0.7)
Eosinophils Relative: 1.1 % (ref 0.0–5.0)
HCT: 43 % (ref 36.0–46.0)
Hemoglobin: 14.3 g/dL (ref 12.0–15.0)
Lymphocytes Relative: 47.1 % — ABNORMAL HIGH (ref 12.0–46.0)
Lymphs Abs: 2.1 10*3/uL (ref 0.7–4.0)
MCHC: 33.3 g/dL (ref 30.0–36.0)
MCV: 91.7 fl (ref 78.0–100.0)
Monocytes Absolute: 0.4 10*3/uL (ref 0.1–1.0)
Monocytes Relative: 9.2 % (ref 3.0–12.0)
Neutro Abs: 1.8 10*3/uL (ref 1.4–7.7)
Neutrophils Relative %: 41.3 % — ABNORMAL LOW (ref 43.0–77.0)
Platelets: 251 10*3/uL (ref 150.0–400.0)
RBC: 4.69 Mil/uL (ref 3.87–5.11)
RDW: 13 % (ref 11.5–15.5)
WBC: 4.5 10*3/uL (ref 4.0–10.5)

## 2019-01-15 LAB — LIPID PANEL
Cholesterol: 229 mg/dL — ABNORMAL HIGH (ref 0–200)
HDL: 54.9 mg/dL (ref >39.00)
LDL Cholesterol: 154 mg/dL — ABNORMAL HIGH (ref 0–99)
NonHDL: 174.5
Total CHOL/HDL Ratio: 4
Triglycerides: 102 mg/dL (ref 0.0–149.0)
VLDL: 20.4 mg/dL (ref 0.0–40.0)

## 2019-01-15 LAB — HEPATIC FUNCTION PANEL
ALT: 45 U/L — ABNORMAL HIGH (ref 0–35)
AST: 31 U/L (ref 0–37)
Albumin: 4.8 g/dL (ref 3.5–5.2)
Alkaline Phosphatase: 59 U/L (ref 39–117)
Bilirubin, Direct: 0.1 mg/dL (ref 0.0–0.3)
Total Bilirubin: 0.9 mg/dL (ref 0.2–1.2)
Total Protein: 7.3 g/dL (ref 6.0–8.3)

## 2019-01-15 LAB — BASIC METABOLIC PANEL
BUN: 12 mg/dL (ref 6–23)
CO2: 31 mEq/L (ref 19–32)
Calcium: 9.7 mg/dL (ref 8.4–10.5)
Chloride: 101 mEq/L (ref 96–112)
Creatinine, Ser: 0.71 mg/dL (ref 0.40–1.20)
GFR: 103.93 mL/min (ref >60.00)
Glucose, Bld: 210 mg/dL — ABNORMAL HIGH (ref 70–99)
Potassium: 4.8 mEq/L (ref 3.5–5.1)
Sodium: 141 mEq/L (ref 135–145)

## 2019-01-15 LAB — VITAMIN B12: Vitamin B-12: 340 pg/mL (ref 211–911)

## 2019-01-15 LAB — TSH: TSH: 0.89 u[IU]/mL (ref 0.35–4.50)

## 2019-01-15 NOTE — Progress Notes (Signed)
Subjective:     Patient ID: Carla Williams, female   DOB: August 03, 1965, 53 y.o.   MRN: [id number]  HPI  Patient is seen for physical examination.  She sees gynecologist yearly for Pap smears and also is getting regular yearly mammograms.  She is due next week for that.  She is followed by endocrinologist for her diabetes.  Chronic medical problems include hypertension, GERD, type 2 diabetes, dyslipidemia.  She works as a Animal nutritionist.  Non-smoker.  No alcohol.  Health maintenance reviewed  -Next tetanus due 2028 -Colonoscopy due 2027 -She is getting yearly mammograms -Has not had pneumococcal vaccine and she declines today -Last eye exam June 2019 and patient is in process of setting up follow-up  Past Medical History:  Diagnosis Date  . Colon polyps    hyperplastic  . Diabetes mellitus   . GERD (gastroesophageal reflux disease)   . Hyperlipemia   . Hypertension   . Obesity    Past Surgical History:  Procedure Laterality Date  . TUBAL LIGATION      reports that she has never smoked. She has never used smokeless tobacco. She reports that she does not drink alcohol or use drugs. family history includes Breast cancer in an other family member; Diabetes in her father; Heart disease in her father. No Known Allergies    Review of Systems  Constitutional: Negative for activity change, appetite change, fatigue, fever and unexpected weight change.  HENT: Negative for ear pain, hearing loss, sore throat and trouble swallowing.   Eyes: Negative for visual disturbance.  Respiratory: Negative for cough and shortness of breath.   Cardiovascular: Negative for chest pain and palpitations.  Gastrointestinal: Negative for abdominal pain, blood in stool, constipation and diarrhea.  Endocrine: Negative for polydipsia and polyuria.  Genitourinary: Negative for dysuria and hematuria.  Musculoskeletal: Negative for arthralgias, back pain and myalgias.  Skin: Negative for rash.   Neurological: Negative for dizziness, syncope and headaches.  Hematological: Negative for adenopathy.  Psychiatric/Behavioral: Negative for confusion and dysphoric mood.       Objective:   Physical Exam Constitutional:      Appearance: She is well-developed.  HENT:     Head: Normocephalic and atraumatic.  Eyes:     Pupils: Pupils are equal, round, and reactive to light.  Neck:     Musculoskeletal: Normal range of motion and neck supple.     Thyroid: No thyromegaly.  Cardiovascular:     Rate and Rhythm: Normal rate and regular rhythm.     Heart sounds: Normal heart sounds. No murmur.  Pulmonary:     Effort: No respiratory distress.     Breath sounds: Normal breath sounds. No wheezing or rales.  Abdominal:     General: Bowel sounds are normal. There is no distension.     Palpations: Abdomen is soft. There is no mass.     Tenderness: There is no abdominal tenderness. There is no guarding or rebound.  Genitourinary:    Comments: Per gyn Musculoskeletal: Normal range of motion.     Right lower leg: No edema.     Left lower leg: No edema.  Lymphadenopathy:     Cervical: No cervical adenopathy.  Skin:    Findings: No rash.  Neurological:     Mental Status: She is alert and oriented to person, place, and time.     Cranial Nerves: No cranial nerve deficit.     Deep Tendon Reflexes: Reflexes normal.  Psychiatric:  Behavior: Behavior normal.        Thought Content: Thought content normal.        Judgment: Judgment normal.        Assessment:     Physical exam.  We discussed the following health maintenance issues    Plan:     -She will continue with GYN follow-up -Recommended Pneumovax but she wishes to wait at this time.  We have also highly advised annual flu vaccine and she will check on coverage for shingles vaccine -We discussed possible B12 level given the fact she has had some recent fatigue and also she is on metformin and Nexium chronically which can  reduce absorption -Obtain screening labs.  We are not checking A1c since this is checked through her endocrinologist regularly  Eulas Post MD [facility]

## 2019-01-15 NOTE — Patient Instructions (Signed)
Consider pneumonia vaccine- once prior to age 53.

## 2019-01-22 ENCOUNTER — Other Ambulatory Visit: Payer: Self-pay

## 2019-01-22 DIAGNOSIS — E785 Hyperlipidemia, unspecified: Secondary | ICD-10-CM

## 2019-02-22 ENCOUNTER — Other Ambulatory Visit: Payer: Self-pay

## 2019-02-22 ENCOUNTER — Ambulatory Visit
Admission: RE | Admit: 2019-02-22 | Discharge: 2019-02-22 | Disposition: A | Discharge status: Home / Self Care | Payer: Managed Care, Other (non HMO) | Source: Ambulatory Visit | Attending: Family Medicine | Admitting: Family Medicine

## 2019-02-22 DIAGNOSIS — Z1231 Encounter for screening mammogram for malignant neoplasm of breast: Secondary | ICD-10-CM

## 2019-02-24 ENCOUNTER — Telehealth: Payer: Self-pay

## 2019-02-24 NOTE — Telephone Encounter (Signed)
Copied from [place] [phone]. Topic: General - Other >> Feb 24, 2019 11:11 AM Leward Quan A wrote: Reason for CRM: Patient called to say that she went to a home where someone tested positive for covid there was no close interaction. But she is needing a call back to inform her on what to do. She have been home on quarantine since being around this person, no symptoms but just concerned.  Patient would like a call back at Ph# [phone]

## 2019-02-25 NOTE — Telephone Encounter (Signed)
We could offer that.   If she had close contact and is not tested should really quarantine for 14 days.

## 2019-02-25 NOTE — Telephone Encounter (Signed)
Patient is aware.  Patient had further questions about returning to work.  Virtual visit scheduled.

## 2019-02-25 NOTE — Telephone Encounter (Signed)
Would you like a virtual visit

## 2019-02-26 ENCOUNTER — Telehealth (INDEPENDENT_AMBULATORY_CARE_PROVIDER_SITE_OTHER): Payer: Managed Care, Other (non HMO) | Admitting: Family Medicine

## 2019-02-26 ENCOUNTER — Other Ambulatory Visit: Payer: Self-pay

## 2019-02-26 DIAGNOSIS — Z20822 Contact with and (suspected) exposure to covid-19: Secondary | ICD-10-CM

## 2019-02-26 DIAGNOSIS — Z20828 Contact with and (suspected) exposure to other viral communicable diseases: Secondary | ICD-10-CM

## 2019-02-26 NOTE — Progress Notes (Signed)
This visit type was conducted due to national recommendations for restrictions regarding the COVID-19 pandemic in an effort to limit this patient's exposure and mitigate transmission in our community.   Virtual Visit via Video Note  I connected with Carla Williams on 02/26/19 at  1:45 PM EDT by a video enabled telemedicine application and verified that I am speaking with the correct person using two identifiers.  Location patient: home Location provider:work or home office Persons participating in the virtual visit: patient, provider  I discussed the limitations of evaluation and management by telemedicine and the availability of in person appointments. The patient expressed understanding and agreed to proceed.   HPI: Patient called to ask some questions regarding COVID testing.  She is totally asymptomatic at this time.  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  The ninth grader did not have a mask and apparently was asymptomatic and she was very briefly just for a couple of minutes possibly less than 6 feet away.  [place] did have her mask on consistently.  The student's mom then walked up and stated that she had COVID.  They were more than 6 feet apart.  Tennis felt fine since then with no symptoms.  She denies any fever, chills, cough, sore throat, diarrhea.  Her concern is that she works another job.   ROS: See pertinent positives and negatives per HPI.  Past Medical History:  Diagnosis Date  . Colon polyps    hyperplastic  . Diabetes mellitus   . GERD (gastroesophageal reflux disease)   . Hyperlipemia   . Hypertension   . Obesity     Past Surgical History:  Procedure Laterality Date  . TUBAL LIGATION      Family History  Problem Relation Age of Onset  . Breast cancer Other        Maternal Great Aunt   . Diabetes Father        and Mother  . Heart disease Father   . Colon cancer Neg Hx   . Stomach cancer Neg Hx   .  Esophageal cancer Neg Hx     SOCIAL HX: Non-smoker   Current Outpatient Medications:  .  atorvastatin (LIPITOR) 40 MG tablet, TAKE 1 TABLET (40 MG TOTAL) BY MOUTH DAILY. ONE TABLET BY MOUTH ONCE DAILY, Disp: 30 tablet, Rfl: 2 .  benazepril-hydrochlorthiazide (LOTENSIN HCT) 10-12.5 MG tablet, TAKE 1 TABLET BY MOUTH EVERY DAY, Disp: 30 tablet, Rfl: 2 .  Dapagliflozin-metFORMIN HCl ER (XIGDUO XR) 5-500 MG TB24, Take 1 tablet by mouth 2 (two) times daily., Disp: , Rfl:  .  dextromethorphan (DELSYM) 30 MG/5ML liquid, Take 10 mLs (60 mg total) by mouth as needed for cough., Disp: 89 mL, Rfl: 0 .  escitalopram (LEXAPRO) 10 MG tablet, TAKE 1 TABLET BY MOUTH EVERY DAY, Disp: 90 tablet, Rfl: 0 .  esomeprazole (NEXIUM) 40 MG capsule, TAKE 1 CAPSULE BY MOUTH DAILY BEFORE BREAKFAST. AS NEEDED, Disp: 90 capsule, Rfl: 0 .  fluticasone (FLONASE) 50 MCG/ACT nasal spray, PLACE 1 SPRAY INTO BOTH NOSTRILS DAILY., Disp: 16 g, Rfl: 2 .  glimepiride (AMARYL) 4 MG tablet, Take 1 tablet (4 mg total) by mouth daily before breakfast., Disp: 30 tablet, Rfl: 3 .  ibuprofen (ADVIL,MOTRIN) 800 MG tablet, Take 1 tablet (800 mg total) by mouth as needed., Disp: 60 tablet, Rfl: 1 .  Insulin Pen Needle (NOVOTWIST) 32G X 5 MM MISC, Use one per day to  inject Victoza, Disp: 50 each, Rfl: 3 .  mupirocin ointment (BACTROBAN) 2 %, Use as needed on open sore up to TID, Disp: 30 g, Rfl: 1 .  ondansetron (ZOFRAN) 4 MG tablet, Take 1 tablet (4 mg total) by mouth every 8 (eight) hours as needed for nausea or vomiting., Disp: 20 tablet, Rfl: 0 .  ONETOUCH DELICA LANCETS 99991111 MISC, Use to check blood sugar 2 times per day dx code E11.9, Disp: 100 each, Rfl: 3 .  ONETOUCH VERIO test strip, USE AS INSTRUCTED TO CHECK BLOOD SUGAR 2 TIMES PER DAY DX CODE E11.9, Disp: 100 each, Rfl: 3  EXAM:  VITALS per patient if applicable:  GENERAL: alert, oriented, appears well and in no acute distress  HEENT: atraumatic, conjunttiva clear, no obvious  abnormalities on inspection of external nose and ears  NECK: normal movements of the head and neck  LUNGS: on inspection no signs of respiratory distress, breathing rate appears normal, no obvious gross SOB, gasping or wheezing  CV: no obvious cyanosis  MS: moves all visible extremities without noticeable abnormality  PSYCH/NEURO: pleasant and cooperative, no obvious depression or anxiety, speech and thought processing grossly intact  ASSESSMENT AND PLAN:  Discussed the following assessment and plan:  Questions regarding possible COVID exposure.  Patient was more than 6 feet apart from someone who had reported positive.  She was possibly less than 6 feet very briefly for seconds with daughter of someone who tested positive for COVID.  This sounds like a relatively low risk situation and patient currently is asymptomatic  -We recommend observation and follow-up promptly for any fever, chills, headache, body aches, loss of taste or smell, dyspnea, diarrhea, nasal congestion, etc.     I discussed the assessment and treatment plan with the patient. The patient was provided an opportunity to ask questions and all were answered. The patient agreed with the plan and demonstrated an understanding of the instructions.   The patient was advised to call back or seek an in-person evaluation if the symptoms worsen or if the condition fails to improve as anticipated.    Carolann Littler, MD

## 2019-04-03 ENCOUNTER — Ambulatory Visit (INDEPENDENT_AMBULATORY_CARE_PROVIDER_SITE_OTHER): Payer: Managed Care, Other (non HMO)

## 2019-04-03 ENCOUNTER — Other Ambulatory Visit: Payer: Self-pay

## 2019-04-03 DIAGNOSIS — Z23 Encounter for immunization: Secondary | ICD-10-CM | POA: Diagnosis not present

## 2019-04-26 ENCOUNTER — Other Ambulatory Visit: Payer: Self-pay | Admitting: Family Medicine

## 2019-08-26 ENCOUNTER — Ambulatory Visit: Payer: Managed Care, Other (non HMO)

## 2019-09-24 ENCOUNTER — Ambulatory Visit: Payer: Managed Care, Other (non HMO) | Attending: Internal Medicine

## 2019-09-24 DIAGNOSIS — Z23 Encounter for immunization: Secondary | ICD-10-CM

## 2019-09-24 NOTE — Progress Notes (Signed)
   Covid-19 Vaccination Clinic  Name:  Carla Williams    MRN: [id number] DOB: [birth year]  09/24/2019  [name] was observed post Covid-19 immunization for 15 minutes without incident. She was provided with Vaccine Information Sheet and instruction to access the V-Safe system.   [name] was instructed to call 911 with any severe reactions post vaccine: Marland Kitchen Difficulty breathing  . Swelling of face and throat  . A fast heartbeat  . A bad rash all over body  . Dizziness and weakness   Immunizations Administered    Name Date Dose VIS Date Route   Pfizer COVID-19 Vaccine 09/24/2019  8:47 AM 0.3 mL 06/11/2019 Intramuscular   Manufacturer: [place]   Lot: [id number]   [place]: [id number]

## 2019-10-18 ENCOUNTER — Ambulatory Visit: Payer: Managed Care, Other (non HMO) | Attending: Internal Medicine

## 2019-10-18 DIAGNOSIS — Z23 Encounter for immunization: Secondary | ICD-10-CM

## 2019-10-18 NOTE — Progress Notes (Signed)
   Covid-19 Vaccination Clinic  Name:  Carla Williams    MRN: [id number] DOB: [birth year]  10/18/2019  [name] was observed post Covid-19 immunization for 15 minutes without incident. She was provided with Vaccine Information Sheet and instruction to access the V-Safe system.   [name] was instructed to call 911 with any severe reactions post vaccine: Marland Kitchen Difficulty breathing  . Swelling of face and throat  . A fast heartbeat  . A bad rash all over body  . Dizziness and weakness   Immunizations Administered    Name Date Dose VIS Date Route   Pfizer COVID-19 Vaccine 10/18/2019  2:05 PM 0.3 mL 08/25/2018 Intramuscular   Manufacturer: [place]   Lot: [id number]   [place]: [id number]

## 2019-10-22 ENCOUNTER — Telehealth: Payer: Self-pay | Admitting: Family Medicine

## 2019-10-22 MED ORDER — LORAZEPAM 0.5 MG PO TABS: 0.5000 mg | ORAL_TABLET | Freq: Every evening | ORAL | 0 refills | Status: DC | PRN

## 2019-10-22 NOTE — Telephone Encounter (Signed)
Please advise 

## 2019-10-22 NOTE — Telephone Encounter (Signed)
Pt scheduled an appt for Monday but would like to see if Dr. Elease Hashimoto could send in prescription for her anxiety. She lost her mom in Jan and and lost her grandmother this Wednesday and she is not sleeping well at all.

## 2019-10-22 NOTE — Telephone Encounter (Signed)
Pt has been informed of medication sent in and to keep appt Monday

## 2019-10-22 NOTE — Telephone Encounter (Signed)
Will send in limited Lorazepam to take one at night prn     Keep appt Monday.

## 2019-10-25 ENCOUNTER — Other Ambulatory Visit: Payer: Self-pay

## 2019-10-25 ENCOUNTER — Ambulatory Visit: Payer: Managed Care, Other (non HMO) | Admitting: Family Medicine

## 2019-10-25 ENCOUNTER — Encounter: Payer: Self-pay | Admitting: Family Medicine

## 2019-10-25 VITALS — BP 126/62 | HR 85 | Temp 97.6°F | Wt 203.1 lb

## 2019-10-25 DIAGNOSIS — F4321 Adjustment disorder with depressed mood: Secondary | ICD-10-CM

## 2019-10-25 DIAGNOSIS — F5102 Adjustment insomnia: Secondary | ICD-10-CM

## 2019-10-25 NOTE — Patient Instructions (Signed)
Managing Loss, Adult People experience loss in many different ways throughout their lives. Events such as moving, changing jobs, and losing friends can create a sense of loss. The loss may be as serious as a major health change, divorce, death of a pet, or death of a loved one. All of these types of loss are likely to create a physical and emotional reaction known as grief. Grief is the result of a major change or an absence of something or someone that you count on. Grief is a normal reaction to loss. A variety of factors can affect your grieving experience, including:  The nature of your loss.  Your relationship to what or whom you lost.  Your understanding of grief and how to manage it.  Your support system. How to manage lifestyle changes Keep to your normal routine as much as possible.  If you have trouble focusing or doing normal activities, it is acceptable to take some time away from your normal routine.  Spend time with friends and loved ones.  Eat a healthy diet, get plenty of sleep, and rest when you feel tired. How to recognize changes  The way that you deal with your grief will affect your ability to function as you normally do. When grieving, you may experience these changes:  Numbness, shock, sadness, anxiety, anger, denial, and guilt.  Thoughts about death.  Unexpected crying.  A physical sensation of emptiness in your stomach.  Problems sleeping and eating.  Tiredness (fatigue).  Loss of interest in normal activities.  Dreaming about or imagining seeing the person who died.  A need to remember what or whom you lost.  Difficulty thinking about anything other than your loss for a period of time.  Relief. If you have been expecting the loss for a while, you may feel a sense of relief when it happens. Follow these instructions at home:  Activity Express your feelings in healthy ways, such as:  Talking with others about your loss. It may be helpful to find  others who have had a similar loss, such as a support group.  Writing down your feelings in a journal.  Doing physical activities to release stress and emotional energy.  Doing creative activities like painting, sculpting, or playing or listening to music.  Practicing resilience. This is the ability to recover and adjust after facing challenges. Reading some resources that encourage resilience may help you to learn ways to practice those behaviors. General instructions  Be patient with yourself and others. Allow the grieving process to happen, and remember that grieving takes time. ? It is likely that you may never feel completely done with some grief. You may find a way to move on while still cherishing memories and feelings about your loss. ? Accepting your loss is a process. It can take months or longer to adjust.  Keep all follow-up visits as told by your health care provider. This is important. Where to find support To get support for managing loss:  Ask your health care provider for help and recommendations, such as grief counseling or therapy.  Think about joining a support group for people who are managing a loss. Where to find more information You can find more information about managing loss from:  American Society of Clinical Oncology: www.cancer.net  American Psychological Association: www.apa.org Contact a health care provider if:  Your grief is extreme and keeps getting worse.  You have ongoing grief that does not improve.  Your body shows symptoms of grief, such   as illness.  You feel depressed, anxious, or lonely. Get help right away if:  You have thoughts about hurting yourself or others. If you ever feel like you may hurt yourself or others, or have thoughts about taking your own life, get help right away. You can go to your nearest emergency department or call:  Your local emergency services (911 in the [place]  A suicide crisis helpline, such as the  [facility] at 571-092-6884. This is open 24 hours a day. Summary  Grief is the result of a major change or an absence of someone or something that you count on. Grief is a normal reaction to loss.  The depth of grief and the period of recovery depend on the type of loss and your ability to adjust to the change and process your feelings.  Processing grief requires patience and a willingness to accept your feelings and talk about your loss with people who are supportive.  It is important to find resources that work for you and to realize that people experience grief differently. There is not one grieving process that works for everyone in the same way.  Be aware that when grief becomes extreme, it can lead to more severe issues like isolation, depression, anxiety, or suicidal thoughts. Talk with your health care provider if you have any of these issues. This information is not intended to replace advice given to you by your health care provider. Make sure you discuss any questions you have with your health care provider. Document Revised: 08/21/2018 Document Reviewed: 10/31/2016 Elsevier Patient Education  [place].  Consider trying some Melatonin OTC 3 to 10 mg

## 2019-10-25 NOTE — Progress Notes (Signed)
Subjective:     Patient ID: Carla Williams, female   DOB: 26-Feb-1966, 54 y.o.   MRN: [id number]  HPI   Severa  had called last week with some issues regarding difficulty sleeping and extreme anxiety.  She has had tremendous stressors recently.  Back in August 23, 2022 her mom passed away suddenly.  She did have COPD and multiple other medical problems and had gotten over Covid infection back in the fall.  Her death was relatively sudden and unexpected.  She also lost her father-in-law in March.  She then lost her grandmother Wednesday.  She called Friday with difficulty sleeping getting very little sleep and had called for something to help settle her nerves.  She has been on Lexapro in the past but currently not taking thing for depression.  She had not tried any over-the-counter medications.  No alcohol use.  She has type 2 diabetes and is followed by endocrinology for that  Past Medical History:  Diagnosis Date  . Colon polyps    hyperplastic  . Diabetes mellitus   . GERD (gastroesophageal reflux disease)   . Hyperlipemia   . Hypertension   . Obesity    Past Surgical History:  Procedure Laterality Date  . TUBAL LIGATION      reports that she has never smoked. She has never used smokeless tobacco. She reports that she does not drink alcohol or use drugs. family history includes Breast cancer in an other family member; Diabetes in her father; Heart disease in her father. No Known Allergies   Review of Systems  Constitutional: Negative for appetite change and unexpected weight change.  Respiratory: Negative for shortness of breath.   Cardiovascular: Negative for chest pain.  Psychiatric/Behavioral: Positive for sleep disturbance. Negative for agitation and suicidal ideas. The patient is nervous/anxious.        Objective:   Physical Exam Vitals reviewed.  Constitutional:      Appearance: Normal appearance.  Cardiovascular:     Rate and Rhythm: Normal rate and regular rhythm.   Pulmonary:     Effort: Pulmonary effort is normal.     Breath sounds: Normal breath sounds.  Neurological:     General: No focal deficit present.     Mental Status: She is alert.     Cranial Nerves: No cranial nerve deficit.  Psychiatric:        Mood and Affect: Mood normal.        Thought Content: Thought content normal.     Comments: PHQ-9 equals 21        Assessment:     Grieving.  Patient has had multiple losses as above.  Even though she scored 21 on PHQ-9 suspect this is mostly normal grieving process.  She has had predominantly difficulty sleeping.  We suggested the following    Plan:     -Consider counseling and she will look at possibly getting this set up through hospice  -Sleep hygiene discussed.  Avoid late the use of caffeine and any bright lights at night.  Consider trial of melatonin 3 to 10 mg at night  -We did write for very limited lorazepam 0.5 mg last week to use only very sparingly for severe insomnia  -We do not feel that she is a candidate right now for antidepressant medication and she agrees.  She will see how the counseling goes first.  She was given written note to be out of work from last Wednesday through May 3 as she has had to do lots  of planning for her grandmother's funeral  Eulas Post MD [facility]

## 2019-11-15 ENCOUNTER — Other Ambulatory Visit: Payer: Self-pay | Admitting: Family Medicine

## 2019-12-02 ENCOUNTER — Other Ambulatory Visit: Payer: Self-pay | Admitting: Family Medicine

## 2019-12-29 ENCOUNTER — Ambulatory Visit (INDEPENDENT_AMBULATORY_CARE_PROVIDER_SITE_OTHER): Payer: Managed Care, Other (non HMO) | Admitting: Family Medicine

## 2019-12-29 ENCOUNTER — Other Ambulatory Visit: Payer: Self-pay

## 2019-12-29 ENCOUNTER — Encounter: Payer: Self-pay | Admitting: Family Medicine

## 2019-12-29 VITALS — BP 110/78 | HR 72 | Temp 97.5°F | Ht 64.5 in | Wt 205.2 lb

## 2019-12-29 DIAGNOSIS — Z23 Encounter for immunization: Secondary | ICD-10-CM

## 2019-12-29 DIAGNOSIS — Z0000 Encounter for general adult medical examination without abnormal findings: Secondary | ICD-10-CM | POA: Diagnosis not present

## 2019-12-29 LAB — LIPID PANEL
Cholesterol: 175 mg/dL (ref 0–200)
HDL: 52 mg/dL (ref >39.00)
LDL Cholesterol: 104 mg/dL — ABNORMAL HIGH (ref 0–99)
NonHDL: 122.55
Total CHOL/HDL Ratio: 3
Triglycerides: 95 mg/dL (ref 0.0–149.0)
VLDL: 19 mg/dL (ref 0.0–40.0)

## 2019-12-29 LAB — HEPATIC FUNCTION PANEL
ALT: 29 U/L (ref 0–35)
AST: 19 U/L (ref 0–37)
Albumin: 4.4 g/dL (ref 3.5–5.2)
Alkaline Phosphatase: 52 U/L (ref 39–117)
Bilirubin, Direct: 0.2 mg/dL (ref 0.0–0.3)
Total Bilirubin: 1.3 mg/dL — ABNORMAL HIGH (ref 0.2–1.2)
Total Protein: 6.8 g/dL (ref 6.0–8.3)

## 2019-12-29 LAB — CBC WITH DIFFERENTIAL/PLATELET
Basophils Absolute: 0 10*3/uL (ref 0.0–0.1)
Basophils Relative: 0.5 % (ref 0.0–3.0)
Eosinophils Absolute: 0.1 10*3/uL (ref 0.0–0.7)
Eosinophils Relative: 1.5 % (ref 0.0–5.0)
HCT: 40.6 % (ref 36.0–46.0)
Hemoglobin: 13.6 g/dL (ref 12.0–15.0)
Lymphocytes Relative: 49.9 % — ABNORMAL HIGH (ref 12.0–46.0)
Lymphs Abs: 2.3 10*3/uL (ref 0.7–4.0)
MCHC: 33.6 g/dL (ref 30.0–36.0)
MCV: 91.7 fl (ref 78.0–100.0)
Monocytes Absolute: 0.5 10*3/uL (ref 0.1–1.0)
Monocytes Relative: 10.3 % (ref 3.0–12.0)
Neutro Abs: 1.8 10*3/uL (ref 1.4–7.7)
Neutrophils Relative %: 37.8 % — ABNORMAL LOW (ref 43.0–77.0)
Platelets: 226 10*3/uL (ref 150.0–400.0)
RBC: 4.42 Mil/uL (ref 3.87–5.11)
RDW: 13.1 % (ref 11.5–15.5)
WBC: 4.6 10*3/uL (ref 4.0–10.5)

## 2019-12-29 LAB — BASIC METABOLIC PANEL
BUN: 9 mg/dL (ref 6–23)
CO2: 28 mEq/L (ref 19–32)
Calcium: 9.2 mg/dL (ref 8.4–10.5)
Chloride: 102 mEq/L (ref 96–112)
Creatinine, Ser: 0.66 mg/dL (ref 0.40–1.20)
GFR: 112.66 mL/min (ref >60.00)
Glucose, Bld: 259 mg/dL — ABNORMAL HIGH (ref 70–99)
Potassium: 4.2 mEq/L (ref 3.5–5.1)
Sodium: 138 mEq/L (ref 135–145)

## 2019-12-29 LAB — TSH: TSH: 0.91 u[IU]/mL (ref 0.35–4.50)

## 2019-12-29 MED ORDER — ATORVASTATIN CALCIUM 40 MG PO TABS: ORAL_TABLET | ORAL | 1 refills | Status: AC

## 2019-12-29 MED ORDER — IBUPROFEN 800 MG PO TABS: 800.0000 mg | ORAL_TABLET | ORAL | 1 refills | Status: DC | PRN

## 2019-12-29 MED ORDER — BENAZEPRIL-HYDROCHLOROTHIAZIDE 10-12.5 MG PO TABS: 1.0000 | ORAL_TABLET | Freq: Every day | ORAL | 5 refills | Status: DC

## 2019-12-29 MED ORDER — LORAZEPAM 0.5 MG PO TABS: 0.5000 mg | ORAL_TABLET | Freq: Every evening | ORAL | 0 refills | Status: DC | PRN

## 2019-12-29 MED ORDER — MUPIROCIN 2 % EX OINT: TOPICAL_OINTMENT | CUTANEOUS | 1 refills | Status: DC

## 2019-12-29 MED ORDER — ESOMEPRAZOLE MAGNESIUM 40 MG PO CPDR: DELAYED_RELEASE_CAPSULE | ORAL | 5 refills | Status: DC

## 2019-12-29 NOTE — Addendum Note (Signed)
Addended by: Marrion Coy on: 12/29/2019 11:17 AM   Modules accepted: Orders

## 2019-12-29 NOTE — Patient Instructions (Signed)

## 2019-12-29 NOTE — Progress Notes (Signed)
Established Patient Office Visit  Subjective:  Patient ID: Carla Williams, female    DOB: 29-Aug-1965  Age: 54 y.o. MRN: [id number]  CC:  Chief Complaint  Patient presents with  . Annual Exam    HPI [name] presents for physical exam.  Her chronic problems include history of type 2 diabetes, GERD, hyperlipidemia, hypertension.  She is followed by endocrinology and also sees gynecology.  She states her last A1c was around 7.  She does not have any document history of Pneumovax.  No history of hepatitis C screening. She is low risk.  Her colonoscopy is up-to-date.  She sees gynecologist every year and gets yearly Pap smears and also has been getting yearly mammograms.  She usually sets that up around August.  No history of shingles vaccine.  Past Medical History:  Diagnosis Date  . Colon polyps    hyperplastic  . Diabetes mellitus   . GERD (gastroesophageal reflux disease)   . Hyperlipemia   . Hypertension   . Obesity     Past Surgical History:  Procedure Laterality Date  . TUBAL LIGATION      Family History  Problem Relation Age of Onset  . Breast cancer Other        Maternal Great Aunt   . Diabetes Father        and Mother  . Heart disease Father   . Colon cancer Neg Hx   . Stomach cancer Neg Hx   . Esophageal cancer Neg Hx     Social History   Socioeconomic History  . Marital status: Married    Spouse name: Not on file  . Number of children: 2  . Years of education: Not on file  . Highest education level: Not on file  Occupational History  . Occupation: Education officer, museum  Tobacco Use  . Smoking status: Never Smoker  . Smokeless tobacco: Never Used  Vaping Use  . Vaping Use: Never used  Substance and Sexual Activity  . Alcohol use: No  . Drug use: No  . Sexual activity: Not on file  Other Topics Concern  . Not on file  Social History Narrative   One caffeine drink daily    Social Determinants of Health   Financial Resource Strain:   .  Difficulty of Paying Living Expenses:   Food Insecurity:   . Worried About Charity fundraiser in the Last Year:   . Arboriculturist in the Last Year:   Transportation Needs:   . Film/video editor (Medical):   Marland Kitchen Lack of Transportation (Non-Medical):   Physical Activity:   . Days of Exercise per Week:   . Minutes of Exercise per Session:   Stress:   . Feeling of Stress :   Social Connections:   . Frequency of Communication with Friends and Family:   . Frequency of Social Gatherings with Friends and Family:   . Attends Religious Services:   . Active Member of Clubs or Organizations:   . Attends Archivist Meetings:   Marland Kitchen Marital Status:   Intimate Partner Violence:   . Fear of Current or Ex-Partner:   . Emotionally Abused:   Marland Kitchen Physically Abused:   . Sexually Abused:     Outpatient Medications Prior to Visit  Medication Sig Dispense Refill  . Dapagliflozin-metFORMIN HCl ER (XIGDUO XR) 5-500 MG TB24 Take 1 tablet by mouth 2 (two) times daily.    Marland Kitchen glimepiride (AMARYL) 4 MG tablet Take 1  tablet (4 mg total) by mouth daily before breakfast. 30 tablet 3  . Insulin Pen Needle (NOVOTWIST) 32G X 5 MM MISC Use one per day to inject Victoza 50 each 3  . ONETOUCH DELICA LANCETS 02H MISC Use to check blood sugar 2 times per day dx code E11.9 100 each 3  . ONETOUCH VERIO test strip USE AS INSTRUCTED TO CHECK BLOOD SUGAR 2 TIMES PER DAY DX CODE E11.9 100 each 3  . atorvastatin (LIPITOR) 40 MG tablet TAKE 1 TABLET (40 MG TOTAL) BY MOUTH DAILY. ONE TABLET BY MOUTH ONCE DAILY 30 tablet 2  . benazepril-hydrochlorthiazide (LOTENSIN HCT) 10-12.5 MG tablet TAKE 1 TABLET BY MOUTH EVERY DAY 30 tablet 5  . esomeprazole (NEXIUM) 40 MG capsule TAKE 1 CAPSULE BY MOUTH DAILY BEFORE BREAKFAST. AS NEEDED 30 capsule 5  . ibuprofen (ADVIL,MOTRIN) 800 MG tablet Take 1 tablet (800 mg total) by mouth as needed. 60 tablet 1  . LORazepam (ATIVAN) 0.5 MG tablet Take 1 tablet (0.5 mg total) by mouth at  bedtime as needed for anxiety. 10 tablet 0  . mupirocin ointment (BACTROBAN) 2 % Use as needed on open sore up to TID 30 g 1  . TRESIBA FLEXTOUCH 100 UNIT/ML FlexTouch Pen 10U ONCE A DAY SUBCUTANEOUS 30 DAYS    . dextromethorphan (DELSYM) 30 MG/5ML liquid Take 10 mLs (60 mg total) by mouth as needed for cough. 89 mL 0  . fluticasone (FLONASE) 50 MCG/ACT nasal spray PLACE 1 SPRAY INTO BOTH NOSTRILS DAILY. 16 g 2  . ondansetron (ZOFRAN) 4 MG tablet Take 1 tablet (4 mg total) by mouth every 8 (eight) hours as needed for nausea or vomiting. 20 tablet 0   No facility-administered medications prior to visit.    No Known Allergies  ROS Review of Systems  Constitutional: Negative for activity change, appetite change, fatigue, fever and unexpected weight change.  HENT: Negative for ear pain, hearing loss, sore throat and trouble swallowing.   Eyes: Negative for visual disturbance.  Respiratory: Negative for cough and shortness of breath.   Cardiovascular: Negative for chest pain and palpitations.  Gastrointestinal: Negative for abdominal pain, blood in stool, constipation and diarrhea.  Genitourinary: Negative for dysuria and hematuria.  Musculoskeletal: Negative for arthralgias, back pain and myalgias.  Skin:       Intermittent dry scaly rash face and extremities.  Neurological: Negative for dizziness, syncope and headaches.  Hematological: Negative for adenopathy.  Psychiatric/Behavioral: Negative for confusion and dysphoric mood.      Objective:    Physical Exam Constitutional:      Appearance: She is well-developed.  HENT:     Head: Normocephalic and atraumatic.  Eyes:     Pupils: Pupils are equal, round, and reactive to light.  Neck:     Thyroid: No thyromegaly.  Cardiovascular:     Rate and Rhythm: Normal rate and regular rhythm.     Heart sounds: Normal heart sounds. No murmur heard.   Pulmonary:     Effort: No respiratory distress.     Breath sounds: Normal breath  sounds. No wheezing or rales.  Abdominal:     General: Bowel sounds are normal. There is no distension.     Palpations: Abdomen is soft. There is no mass.     Tenderness: There is no abdominal tenderness. There is no guarding or rebound.  Genitourinary:    Comments: Per GYN Musculoskeletal:        General: Normal range of motion.     Cervical  back: Normal range of motion and neck supple.  Lymphadenopathy:     Cervical: No cervical adenopathy.  Skin:    Findings: No rash.  Neurological:     Mental Status: She is alert and oriented to person, place, and time.     Cranial Nerves: No cranial nerve deficit.     Deep Tendon Reflexes: Reflexes normal.  Psychiatric:        Behavior: Behavior normal.        Thought Content: Thought content normal.        Judgment: Judgment normal.     BP 110/78 (BP Location: Left Arm, Patient Position: Sitting, Cuff Size: Large)   Pulse 72   Temp (!) 97.5 F (36.4 C) (Temporal)   Ht 5' 4.5" (1.638 m)   Wt 205 lb 3.2 oz (93.1 kg)   LMP 03/15/2013   SpO2 98%   BMI 34.68 kg/m  Wt Readings from Last 3 Encounters:  12/29/19 205 lb 3.2 oz (93.1 kg)  10/25/19 203 lb 1.6 oz (92.1 kg)  01/15/19 200 lb 3.2 oz (90.8 kg)     Health Maintenance Due  Topic Date Due  . Hepatitis C Screening  Never done  . HIV Screening  Never done    There are no preventive care reminders to display for this patient.  Lab Results  Component Value Date   TSH 0.89 01/15/2019   Lab Results  Component Value Date   WBC 4.5 01/15/2019   HGB 14.3 01/15/2019   HCT 43.0 01/15/2019   MCV 91.7 01/15/2019   PLT 251.0 01/15/2019   Lab Results  Component Value Date   NA 141 01/15/2019   K 4.8 01/15/2019   CO2 31 01/15/2019   GLUCOSE 210 (H) 01/15/2019   BUN 12 01/15/2019   CREATININE 0.71 01/15/2019   BILITOT 0.9 01/15/2019   ALKPHOS 59 01/15/2019   AST 31 01/15/2019   ALT 45 (H) 01/15/2019   PROT 7.3 01/15/2019   ALBUMIN 4.8 01/15/2019   CALCIUM 9.7 01/15/2019     GFR 103.93 01/15/2019   Lab Results  Component Value Date   CHOL 229 (H) 01/15/2019   Lab Results  Component Value Date   HDL 54.90 01/15/2019   Lab Results  Component Value Date   LDLCALC 154 (H) 01/15/2019   Lab Results  Component Value Date   TRIG 102.0 01/15/2019   Lab Results  Component Value Date   CHOLHDL 4 01/15/2019   Lab Results  Component Value Date   HGBA1C 7.2 09/08/2017      Assessment & Plan:   Problem List Items Addressed This Visit    None    Visit Diagnoses    Physical exam    -  Primary   Relevant Orders   Basic metabolic panel   Lipid panel   CBC with Differential/Platelet   TSH   Hepatic function panel    Will check labs above and also hepatitis C antibody We recommended one-time Pneumovax prior to age 14 We discussed Shingrix vaccine and she would like to check on insurance coverage first She plans to continue regular follow-up with endocrinology.  Meds ordered this encounter  Medications  . atorvastatin (LIPITOR) 40 MG tablet    Sig: TAKE 1 TABLET (40 MG TOTAL) BY MOUTH DAILY. ONE TABLET BY MOUTH ONCE DAILY    Dispense:  90 tablet    Refill:  1    Patient needs to schedule an appt for lab test  . benazepril-hydrochlorthiazide (LOTENSIN HCT)  10-12.5 MG tablet    Sig: Take 1 tablet by mouth daily.    Dispense:  30 tablet    Refill:  5  . esomeprazole (NEXIUM) 40 MG capsule    Sig: Take one capsule by mouth once daily.    Dispense:  30 capsule    Refill:  5  . ibuprofen (ADVIL) 800 MG tablet    Sig: Take 1 tablet (800 mg total) by mouth as needed.    Dispense:  30 tablet    Refill:  1  . mupirocin ointment (BACTROBAN) 2 %    Sig: Use as needed on open sore up to TID    Dispense:  30 g    Refill:  1  . LORazepam (ATIVAN) 0.5 MG tablet    Sig: Take 1 tablet (0.5 mg total) by mouth at bedtime as needed for anxiety.    Dispense:  10 tablet    Refill:  0    Follow-up: No follow-ups on file.    Carolann Littler, MD

## 2020-01-18 ENCOUNTER — Other Ambulatory Visit: Payer: Self-pay | Admitting: Family Medicine

## 2020-01-18 DIAGNOSIS — Z1231 Encounter for screening mammogram for malignant neoplasm of breast: Secondary | ICD-10-CM

## 2020-02-03 ENCOUNTER — Encounter: Payer: Self-pay | Admitting: Family Medicine

## 2020-02-14 LAB — HM DIABETES EYE EXAM

## 2020-02-23 ENCOUNTER — Telehealth: Payer: Self-pay | Admitting: Internal Medicine

## 2020-02-23 ENCOUNTER — Ambulatory Visit
Admission: RE | Admit: 2020-02-23 | Discharge: 2020-02-23 | Disposition: A | Discharge status: Home / Self Care | Payer: Managed Care, Other (non HMO) | Source: Ambulatory Visit | Attending: Family Medicine | Admitting: Family Medicine

## 2020-02-23 DIAGNOSIS — Z1231 Encounter for screening mammogram for malignant neoplasm of breast: Secondary | ICD-10-CM

## 2020-02-23 IMAGING — MG DIGITAL SCREENING BILAT W/ TOMO W/ CAD
8 series · 8 of 24 positions shown · non-contrast
Comparison: Previous exam(s).

CLINICAL DATA: Screening.

EXAM:
DIGITAL SCREENING BILATERAL MAMMOGRAM WITH TOMO AND CAD

[L MLO synth-2D]
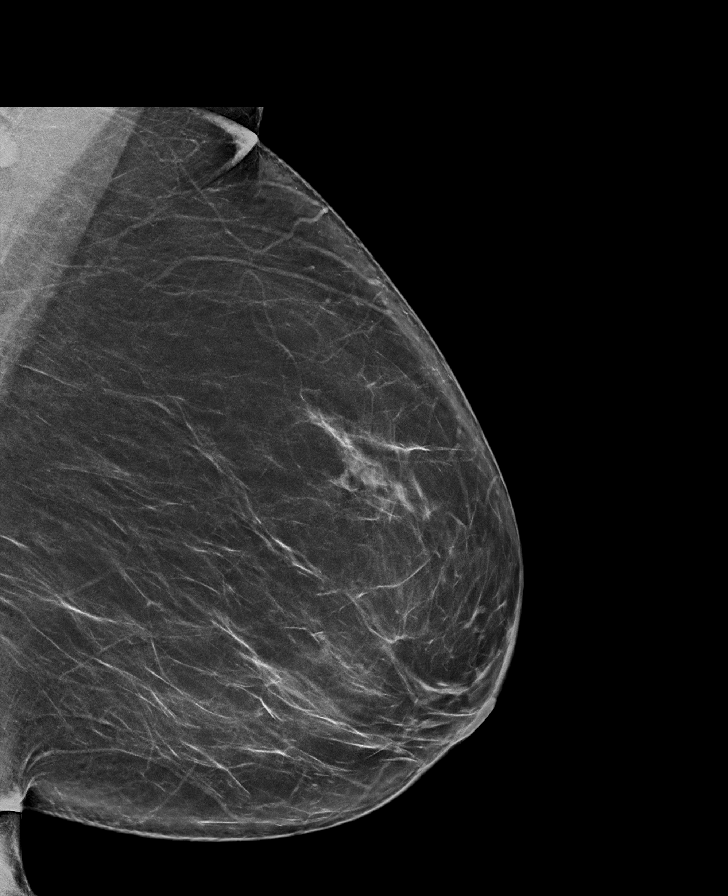

[R CC synth-2D]
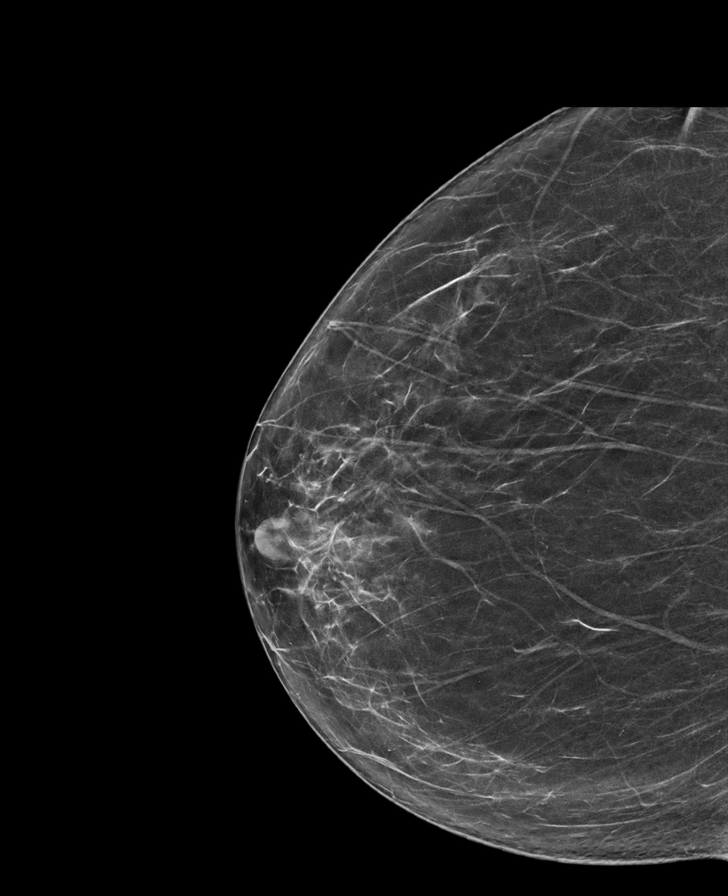

[R MLO synth-2D]
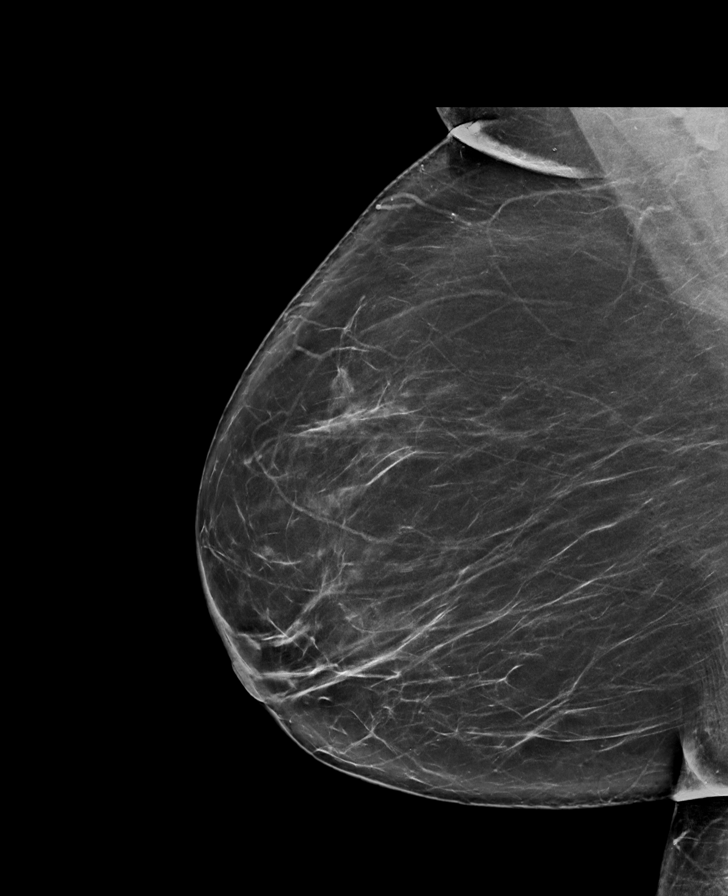

[L CC synth-2D]
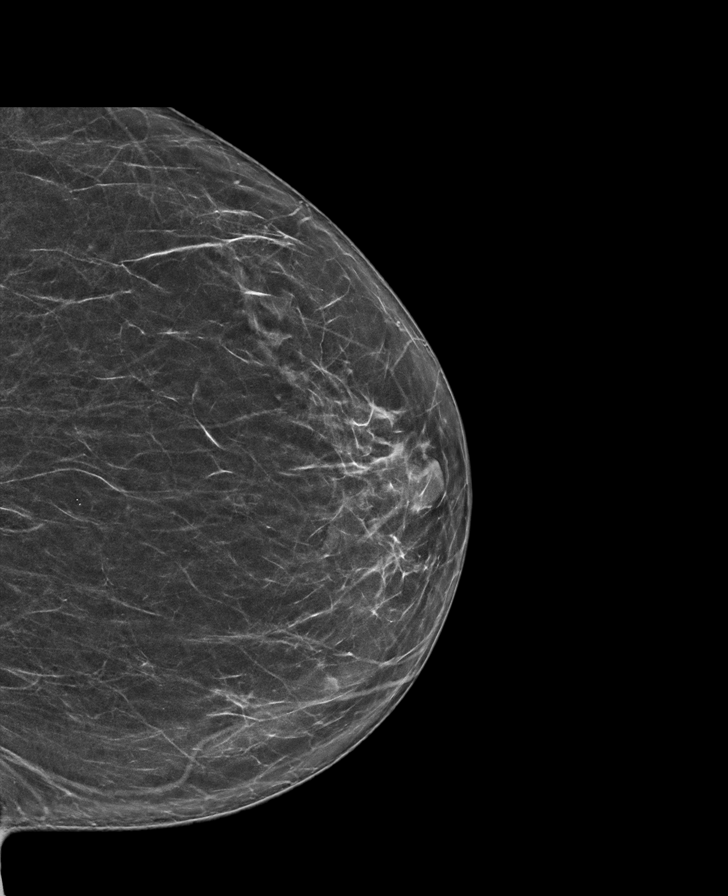

[L MLO tomo · tomo slice 43/85.0]
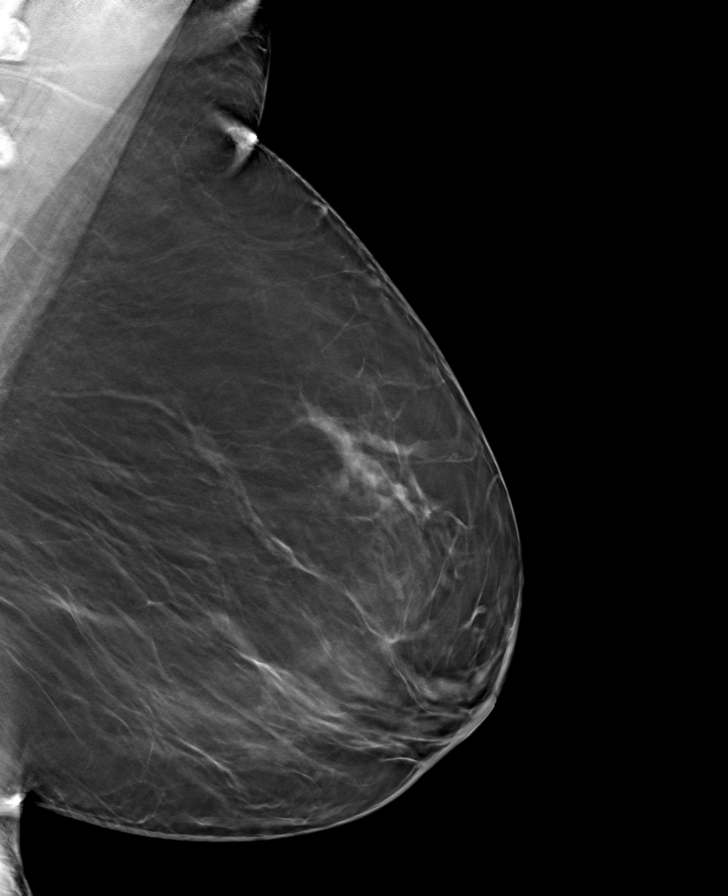

[R CC tomo · tomo slice 37/73.0]
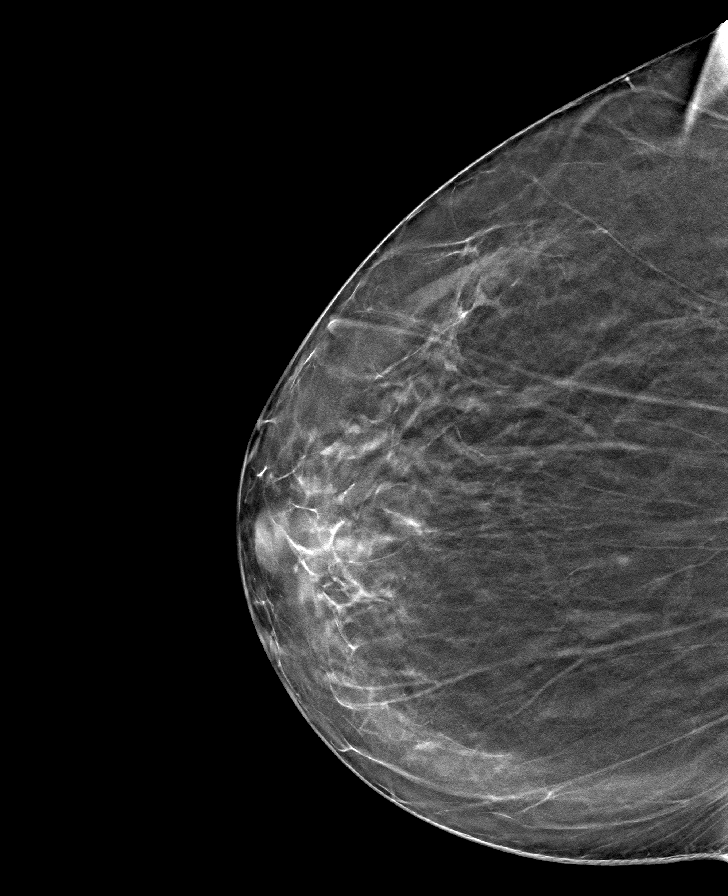

[R MLO tomo · tomo slice 45/90.0]
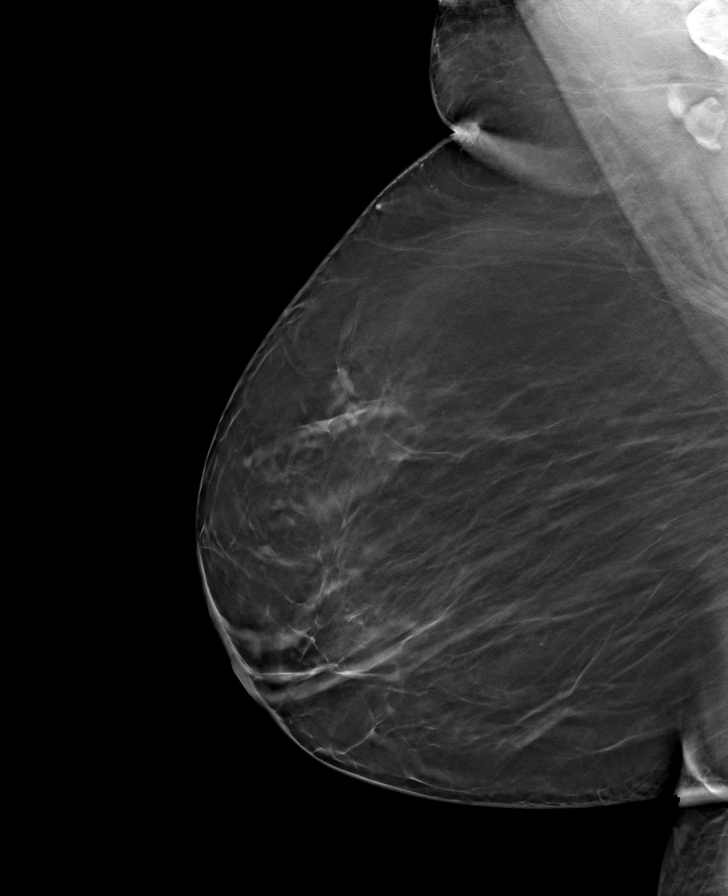

[L CC tomo · tomo slice 35/70.0]
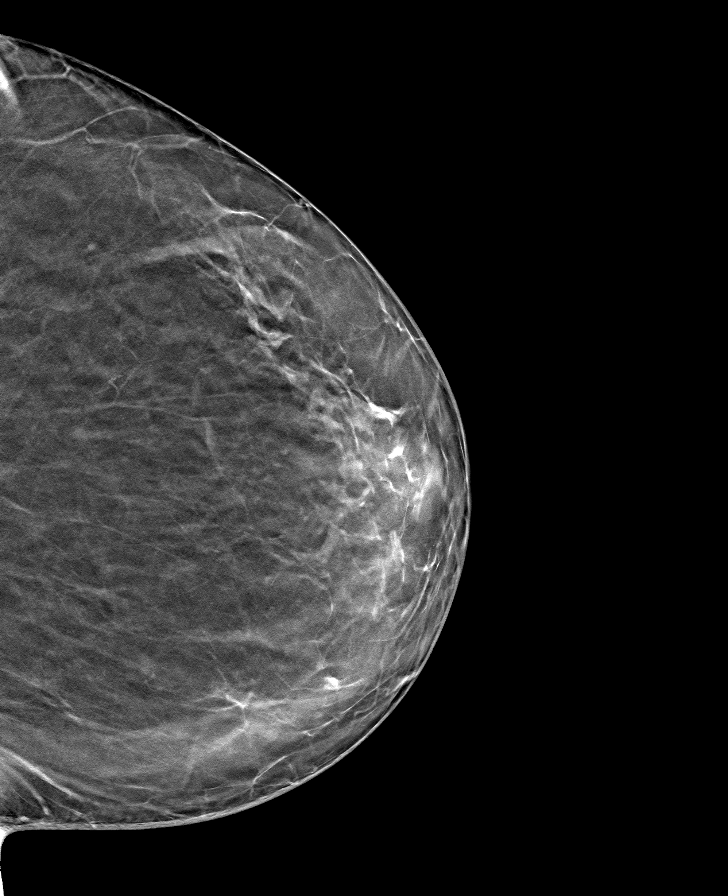

[8 of 24 positions shown; findings below may reference images not displayed]

ACR Breast Density Category b: There are scattered areas of
fibroglandular density.
FINDINGS: There are no findings suspicious for malignancy. Images were
processed with CAD.
IMPRESSION: No mammographic evidence of malignancy. A result letter of this
screening mammogram will be mailed directly to the patient.

RECOMMENDATION:
Screening mammogram in one year. (Code:[TQ])

BI-RADS CATEGORY  1: Negative.

## 2020-02-23 NOTE — Telephone Encounter (Signed)
Pt states she takes nexium daily but she has been having issues with indigestion. Reports when she eats it feels like the food is just sitting there. Also states she has had some constipation issues lately also. Pt requesting an appt to see Dr Henrene Pastor. Pt scheduled to see Dr. Henrene Pastor 03/03/20@3 :40pm, she is aware of appt. Also discussed with pt that she could try Pepcid OTC at bedtime to see if that helped until her appt.

## 2020-03-03 ENCOUNTER — Encounter: Payer: Self-pay | Admitting: Internal Medicine

## 2020-03-03 ENCOUNTER — Ambulatory Visit: Payer: Managed Care, Other (non HMO) | Admitting: Internal Medicine

## 2020-03-03 VITALS — BP 116/88 | HR 84 | Ht 64.5 in | Wt 208.4 lb

## 2020-03-03 DIAGNOSIS — R112 Nausea with vomiting, unspecified: Secondary | ICD-10-CM | POA: Diagnosis not present

## 2020-03-03 DIAGNOSIS — Z6835 Body mass index (BMI) 35.0-35.9, adult: Secondary | ICD-10-CM

## 2020-03-03 DIAGNOSIS — K5901 Slow transit constipation: Secondary | ICD-10-CM

## 2020-03-03 NOTE — Progress Notes (Signed)
HISTORY OF PRESENT ILLNESS:  Carla Williams is a 54 y.o. female with longstanding poorly controlled diabetes mellitus, GERD, and obesity who presents today regarding problems with postprandial bloating or fullness and intermittent nausea with vomiting.  I last saw the patient in 2017.  February 2017 she underwent routine screening colonoscopy.  She was found to have non-adenomatous colon polyps and an isolated diverticulum.  Follow-up in 10 years recommended upper endoscopy in June 2017 to evaluate reflux and nausea with vomiting is found to be normal.  Prescribe PPI.  She tells me that she has been on Nexium 40 mg daily.  Over the past few months she has developed symptoms of postprandial fullness or sensation of poor digestion.  She also describes on certain mornings while brushing her teeth she will develop nausea with vomiting.  She vomits undigested food from the previous day.  She has no classic active reflux symptoms at present.  No dysphagia.  Her weight has fluctuated but is down 13 pounds from her last visit.  She tells me that her diabetes has been poorly controlled with hemoglobin A1c of 9.0 in July.  She was placed on Ozempic.  Around that time she began to develop constipation.  She does not take agents for her bowels.  No other GI complaints.  Review of outside laboratories from December 29, 2019 finds unremarkable comprehensive metabolic panel save elevated glucose.  Normal CBC with hemoglobin 13.6.  REVIEW OF SYSTEMS:  All non-GI ROS negative unless otherwise stated in the HPI except for anxiety, night sweats, skin rash  Past Medical History:  Diagnosis Date  . Colon polyps    hyperplastic  . Diabetes mellitus   . GERD (gastroesophageal reflux disease)   . Hyperlipemia   . Hypertension   . Obesity     Past Surgical History:  Procedure Laterality Date  . TUBAL LIGATION      Social History Carla Williams  reports that she has never smoked. She has never used smokeless tobacco. She  reports that she does not drink alcohol and does not use drugs.  family history includes Breast cancer in an other family member; Diabetes in her father; Heart disease in her father.  No Known Allergies     PHYSICAL EXAMINATION: Vital signs: BP 116/88 (BP Location: Left Arm, Patient Position: Sitting, Cuff Size: Normal)   Pulse 84   Ht 5' 4.5" (1.638 m) Comment: height measured without shoes  Wt 208 lb 6 oz (94.5 kg)   LMP 03/15/2013   BMI 35.22 kg/m   Constitutional: generally well-appearing, no acute distress Psychiatric: alert and oriented x3, cooperative Eyes: extraocular movements intact, anicteric, conjunctiva pink Mouth: oral pharynx moist, no lesions Neck: supple no lymphadenopathy Cardiovascular: heart regular rate and rhythm, no murmur Lungs: clear to auscultation bilaterally Abdomen: soft, nontender, nondistended, no obvious ascites, no peritoneal signs, normal bowel sounds, no organomegaly.  No succussion splash Rectal: Omitted Extremities: no clubbing, cyanosis, or lower extremity edema bilaterally Skin: no lesions on visible extremities Neuro: No focal deficits.  Cranial nerves intact  ASSESSMENT:  1.  Postprandial fullness and bloating with occasional vomiting of undigested food in the mornings as described.  Concerned about diabetic gastroparesis 2.  Poorly controlled diabetes mellitus 3.  Normal EGD 2017 4.  Essentially normal colonoscopy 2017 5.  Obesity 6.  GERD 7.  Functional constipation  PLAN:  1.  Reflux precautions with attention to weight loss 2.  Continue PPI 3.  Importance of good diabetic control stressed 4.  Order a solid-phase gastric emptying scan to rule out gastroparesis. 5.  Stool softeners for constipation 6.  Further recommendations and follow-up to be determined after the above. A total time of 45 minutes was spent.  See dictation, reviewing outside tests, endoscopy reports, obtaining comprehensive history, performing comprehensive  physical exam, counseling the patient regarding her above listed issues, directing medical therapy, ordering advanced radiology testing, and documenting clinical information in the health

## 2020-03-03 NOTE — Patient Instructions (Signed)
You have been scheduled for a gastric emptying scan at [facility] on 03/15/2020 at 7:30am. Please arrive at least 15 minutes prior to your appointment for registration. Please make certain not to have anything to eat or drink after midnight the night before your test. Hold all stomach medications (ex: Zofran, phenergan, Reglan) 48 hours prior to your test. If you need to reschedule your appointment, please contact radiology scheduling at [phone]. _____________________________________________________________________ A gastric-emptying study measures how long it takes for food to move through your stomach. There are several ways to measure stomach emptying. In the most common test, you eat food that contains a small amount of radioactive material. A scanner that detects the movement of the radioactive material is placed over your abdomen to monitor the rate at which food leaves your stomach. This test normally takes about 4 hours to complete. _____________________________________________________________________

## 2020-03-15 ENCOUNTER — Ambulatory Visit (HOSPITAL_COMMUNITY): Payer: Managed Care, Other (non HMO)

## 2020-03-24 ENCOUNTER — Encounter: Payer: Self-pay | Admitting: Family Medicine

## 2020-05-23 ENCOUNTER — Ambulatory Visit: Payer: Managed Care, Other (non HMO) | Attending: Internal Medicine

## 2020-05-23 DIAGNOSIS — Z23 Encounter for immunization: Secondary | ICD-10-CM

## 2020-05-23 NOTE — Progress Notes (Signed)
   Covid-19 Vaccination Clinic  Name:  Carla Williams    MRN: [id number] DOB: 01-30-1966  05/23/2020  Carla Williams was observed post Covid-19 immunization for 15 minutes without incident. She was provided with Vaccine Information Sheet and instruction to access the V-Safe system.   Carla Williams was instructed to call 911 with any severe reactions post vaccine: Marland Kitchen Difficulty breathing  . Swelling of face and throat  . A fast heartbeat  . A bad rash all over body  . Dizziness and weakness   Immunizations Administered    Name Date Dose VIS Date Route   Pfizer COVID-19 Vaccine 05/23/2020  1:49 PM 0.3 mL 04/19/2020 Intramuscular   Manufacturer: [place]   Lot: [id number]   [place]: [address]-3

## 2020-11-09 ENCOUNTER — Other Ambulatory Visit: Payer: Self-pay | Admitting: Family Medicine

## 2020-11-13 ENCOUNTER — Other Ambulatory Visit: Payer: Self-pay | Admitting: Family Medicine

## 2020-11-13 DIAGNOSIS — Z1231 Encounter for screening mammogram for malignant neoplasm of breast: Secondary | ICD-10-CM

## 2020-12-06 ENCOUNTER — Other Ambulatory Visit: Payer: Self-pay | Admitting: Family Medicine

## 2021-01-10 ENCOUNTER — Ambulatory Visit (INDEPENDENT_AMBULATORY_CARE_PROVIDER_SITE_OTHER): Payer: Managed Care, Other (non HMO) | Admitting: Family Medicine

## 2021-01-10 ENCOUNTER — Encounter: Payer: Self-pay | Admitting: Family Medicine

## 2021-01-10 ENCOUNTER — Other Ambulatory Visit: Payer: Self-pay

## 2021-01-10 VITALS — BP 132/88 | HR 86 | Temp 98.1°F | Ht 64.5 in | Wt 201.6 lb

## 2021-01-10 DIAGNOSIS — Z0000 Encounter for general adult medical examination without abnormal findings: Secondary | ICD-10-CM

## 2021-01-10 LAB — LIPID PANEL
Cholesterol: 159 mg/dL (ref 0–200)
HDL: 54.2 mg/dL (ref >39.00)
LDL Cholesterol: 91 mg/dL (ref 0–99)
NonHDL: 104.51
Total CHOL/HDL Ratio: 3
Triglycerides: 68 mg/dL (ref 0.0–149.0)
VLDL: 13.6 mg/dL (ref 0.0–40.0)

## 2021-01-10 LAB — CBC WITH DIFFERENTIAL/PLATELET
Basophils Absolute: 0 10*3/uL (ref 0.0–0.1)
Basophils Relative: 0.5 % (ref 0.0–3.0)
Eosinophils Absolute: 0.1 10*3/uL (ref 0.0–0.7)
Eosinophils Relative: 1.1 % (ref 0.0–5.0)
HCT: 43.3 % (ref 36.0–46.0)
Hemoglobin: 14.6 g/dL (ref 12.0–15.0)
Lymphocytes Relative: 42.9 % (ref 12.0–46.0)
Lymphs Abs: 2.1 10*3/uL (ref 0.7–4.0)
MCHC: 33.8 g/dL (ref 30.0–36.0)
MCV: 90.1 fl (ref 78.0–100.0)
Monocytes Absolute: 0.3 10*3/uL (ref 0.1–1.0)
Monocytes Relative: 6.1 % (ref 3.0–12.0)
Neutro Abs: 2.4 10*3/uL (ref 1.4–7.7)
Neutrophils Relative %: 49.4 % (ref 43.0–77.0)
Platelets: 232 10*3/uL (ref 150.0–400.0)
RBC: 4.81 Mil/uL (ref 3.87–5.11)
RDW: 13.2 % (ref 11.5–15.5)
WBC: 4.9 10*3/uL (ref 4.0–10.5)

## 2021-01-10 LAB — BASIC METABOLIC PANEL
BUN: 11 mg/dL (ref 6–23)
CO2: 28 mEq/L (ref 19–32)
Calcium: 9.8 mg/dL (ref 8.4–10.5)
Chloride: 103 mEq/L (ref 96–112)
Creatinine, Ser: 0.77 mg/dL (ref 0.40–1.20)
GFR: 86.69 mL/min (ref >60.00)
Glucose, Bld: 107 mg/dL — ABNORMAL HIGH (ref 70–99)
Potassium: 4.1 mEq/L (ref 3.5–5.1)
Sodium: 142 mEq/L (ref 135–145)

## 2021-01-10 LAB — HEPATIC FUNCTION PANEL
ALT: 37 U/L — ABNORMAL HIGH (ref 0–35)
AST: 21 U/L (ref 0–37)
Albumin: 4.7 g/dL (ref 3.5–5.2)
Alkaline Phosphatase: 60 U/L (ref 39–117)
Bilirubin, Direct: 0.2 mg/dL (ref 0.0–0.3)
Total Bilirubin: 0.9 mg/dL (ref 0.2–1.2)
Total Protein: 7.4 g/dL (ref 6.0–8.3)

## 2021-01-10 LAB — TSH: TSH: 1.29 u[IU]/mL (ref 0.35–5.50)

## 2021-01-10 NOTE — Progress Notes (Signed)
Established Patient Office Visit  Subjective:  Patient ID: Carla Williams, female    DOB: 1965-12-28  Age: 55 y.o. MRN: [id number]  CC:  Chief Complaint  Patient presents with   Annual Exam    No new concerns, has a physical form to be completed for work    HPI Carla Williams presents for physical exam.  She sees endocrinologist regarding her diabetes and sees gynecologist yearly.  She is up-to-date regarding Pap smears and mammograms.  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  She has no limitations regarding vision, hearing, heart, pulmonary.  No risk factors for tuberculosis.  Health maintenance reviewed:  -No history of shingles vaccine -COVID vaccines up-to-date -No history of hepatitis C screening -Gets annual flu vaccine -Colonoscopy due 2027 -Tetanus due 2028  Family history-Father had coronary disease in his 12s and has diabetes type 2.  Mother had history of COPD and died of what sounds like COVID complications.  She has 1 brother and 1 sister.  1 brother with COPD history.  He is a smoker also  Social history-she is married and has 1 daughter.  4 grandchildren.  1 son.  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  Rare alcohol use.  Past Medical History:  Diagnosis Date   Colon polyps    hyperplastic   Diabetes mellitus    GERD (gastroesophageal reflux disease)    Hyperlipemia    Hypertension    Obesity     Past Surgical History:  Procedure Laterality Date   TUBAL LIGATION      Family History  Problem Relation Age of Onset   COPD Mother    Diabetes Father        and Mother   Heart disease Father    COPD Brother    Breast cancer Other        Maternal Great Aunt    Colon cancer Neg Hx    Stomach cancer Neg Hx    Esophageal cancer Neg Hx     Social History   Socioeconomic History   Marital status: Married    Spouse name: Not on file    Number of children: 2   Years of education: Not on file   Highest education level: Not on file  Occupational History   Occupation: Education officer, museum  Tobacco Use   Smoking status: Never   Smokeless tobacco: Never  Vaping Use   Vaping Use: Never used  Substance and Sexual Activity   Alcohol use: No   Drug use: No   Sexual activity: Not on file  Other Topics Concern   Not on file  Social History Narrative   One caffeine drink daily    Social Determinants of Health   Financial Resource Strain: Not on file  Food Insecurity: Not on file  Transportation Needs: Not on file  Physical Activity: Not on file  Stress: Not on file  Social Connections: Not on file  Intimate Partner Violence: Not on file    Outpatient Medications Prior to Visit  Medication Sig Dispense Refill   atorvastatin (LIPITOR) 40 MG tablet TAKE 1 TABLET (40 MG TOTAL) BY MOUTH DAILY. ONE TABLET BY MOUTH ONCE DAILY 90 tablet 1   benazepril-hydrochlorthiazide (LOTENSIN HCT) 10-12.5 MG tablet Take 1 tablet by mouth daily. 30 tablet 5   Dapagliflozin-metFORMIN HCl ER 5-500 MG TB24 Take 1 tablet by mouth 2 (  two) times daily.     esomeprazole (NEXIUM) 40 MG capsule TAKE 1 CAPSULE BY MOUTH EVERY DAY 30 capsule 1   glimepiride (AMARYL) 4 MG tablet Take 1 tablet (4 mg total) by mouth daily before breakfast. 30 tablet 3   ibuprofen (ADVIL) 800 MG tablet TAKE 1 TABLET (800 MG TOTAL) BY MOUTH AS NEEDED. 30 tablet 1   Insulin Pen Needle (NOVOTWIST) 32G X 5 MM MISC Use one per day to inject Victoza 50 each 3   LORazepam (ATIVAN) 0.5 MG tablet Take 1 tablet (0.5 mg total) by mouth at bedtime as needed for anxiety. 10 tablet 0   mupirocin ointment (BACTROBAN) 2 % Use as needed on open sore up to TID 30 g 1   ONETOUCH DELICA LANCETS 60F MISC Use to check blood sugar 2 times per day dx code E11.9 100 each 3   ONETOUCH VERIO test strip USE AS INSTRUCTED TO CHECK BLOOD SUGAR 2 TIMES PER DAY DX CODE E11.9 100 each 3   OZEMPIC, 0.25 OR 0.5  MG/DOSE, 2 MG/1.5ML SOPN Inject 0.25 mg into the skin once a week.     TRESIBA FLEXTOUCH 100 UNIT/ML FlexTouch Pen 10U ONCE A DAY SUBCUTANEOUS 30 DAYS     No facility-administered medications prior to visit.    No Known Allergies  ROS Review of Systems  Constitutional:  Negative for activity change, appetite change, fatigue, fever and unexpected weight change.  HENT:  Negative for ear pain, hearing loss, sore throat and trouble swallowing.   Eyes:  Negative for visual disturbance.  Respiratory:  Negative for cough and shortness of breath.   Cardiovascular:  Negative for chest pain and palpitations.  Gastrointestinal:  Negative for abdominal pain, blood in stool, constipation and diarrhea.  Genitourinary:  Negative for dysuria and hematuria.  Musculoskeletal:  Negative for arthralgias, back pain and myalgias.  Skin:  Negative for rash.  Neurological:  Negative for dizziness, syncope and headaches.  Hematological:  Negative for adenopathy.  Psychiatric/Behavioral:  Negative for confusion and dysphoric mood.      Objective:    Physical Exam Constitutional:      Appearance: She is well-developed.  HENT:     Head: Normocephalic and atraumatic.  Eyes:     Pupils: Pupils are equal, round, and reactive to light.  Neck:     Thyroid: No thyromegaly.  Cardiovascular:     Rate and Rhythm: Normal rate and regular rhythm.     Heart sounds: Normal heart sounds. No murmur heard. Pulmonary:     Effort: No respiratory distress.     Breath sounds: Normal breath sounds. No wheezing or rales.  Abdominal:     General: Bowel sounds are normal. There is no distension.     Palpations: Abdomen is soft. There is no mass.     Tenderness: There is no abdominal tenderness. There is no guarding or rebound.  Musculoskeletal:        General: Normal range of motion.     Cervical back: Normal range of motion and neck supple.  Lymphadenopathy:     Cervical: No cervical adenopathy.  Skin:    Findings:  No rash.  Neurological:     Mental Status: She is alert and oriented to person, place, and time.     Cranial Nerves: No cranial nerve deficit.     Deep Tendon Reflexes: Reflexes normal.  Psychiatric:        Behavior: Behavior normal.        Thought Content: Thought content  normal.        Judgment: Judgment normal.    BP 132/88 (BP Location: Left Arm, Cuff Size: Normal)   Pulse 86   Temp 98.1 F (36.7 C) (Oral)   Ht 5' 4.5" (1.638 m)   Wt 201 lb 9.6 oz (91.4 kg)   LMP 03/15/2013   SpO2 98%   BMI 34.07 kg/m  Wt Readings from Last 3 Encounters:  01/10/21 201 lb 9.6 oz (91.4 kg)  03/03/20 208 lb 6 oz (94.5 kg)  12/29/19 205 lb 3.2 oz (93.1 kg)     Health Maintenance Due  Topic Date Due   HIV Screening  Never done   Hepatitis C Screening  Never done   Zoster Vaccines- Shingrix (1 of 2) Never done   HEMOGLOBIN A1C  03/01/2020   COVID-19 Vaccine (4 - Booster for Pfizer series) 09/20/2020   FOOT EXAM  12/28/2020    There are no preventive care reminders to display for this patient.  Lab Results  Component Value Date   TSH 0.91 12/29/2019   Lab Results  Component Value Date   WBC 4.6 12/29/2019   HGB 13.6 12/29/2019   HCT 40.6 12/29/2019   MCV 91.7 12/29/2019   PLT 226.0 12/29/2019   Lab Results  Component Value Date   NA 138 12/29/2019   K 4.2 12/29/2019   CO2 28 12/29/2019   GLUCOSE 259 (H) 12/29/2019   BUN 9 12/29/2019   CREATININE 0.66 12/29/2019   BILITOT 1.3 (H) 12/29/2019   ALKPHOS 52 12/29/2019   AST 19 12/29/2019   ALT 29 12/29/2019   PROT 6.8 12/29/2019   ALBUMIN 4.4 12/29/2019   CALCIUM 9.2 12/29/2019   GFR 112.66 12/29/2019   Lab Results  Component Value Date   CHOL 175 12/29/2019   Lab Results  Component Value Date   HDL 52.00 12/29/2019   Lab Results  Component Value Date   LDLCALC 104 (H) 12/29/2019   Lab Results  Component Value Date   TRIG 95.0 12/29/2019   Lab Results  Component Value Date   CHOLHDL 3 12/29/2019   Lab  Results  Component Value Date   HGBA1C 7.2 09/08/2017      Assessment & Plan:   Problem List Items Addressed This Visit   None Visit Diagnoses     Physical exam    -  Primary   Relevant Orders   Basic metabolic panel   Lipid panel   CBC with Differential/Platelet   TSH   Hepatic function panel   Hep C Antibody   QuantiFERON-TB Gold Plus     Patient has chronic medical problems including type 2 diabetes, history of GERD, hyperlipidemia, hypertension.  Followed by endocrinology.  We discussed the following health maintenance issues  -She will continue GYN follow-up regarding Pap smears and mammograms. -Recommend annual flu vaccine -Obtain screening labs as above.  Check QuantiFERON gold assay as a requirement for work and also check hepatitis C antibody -She will check on insurance coverage for Shingrix and schedule separately as a nurse visit if interested  No orders of the defined types were placed in this encounter.   Follow-up: No follow-ups on file.    Carolann Littler, MD

## 2021-01-12 ENCOUNTER — Other Ambulatory Visit: Payer: Self-pay | Admitting: Family Medicine

## 2021-01-12 LAB — QUANTIFERON-TB GOLD PLUS
Mitogen-NIL: >10 IU/mL
NIL: 0.03 IU/mL
QuantiFERON-TB Gold Plus: NEGATIVE
TB1-NIL: 0.01 IU/mL
TB2-NIL: 0 IU/mL

## 2021-01-12 LAB — HEPATITIS C ANTIBODY
Hepatitis C Ab: NONREACTIVE
SIGNAL TO CUT-OFF: 0 (ref <1.00)

## 2021-01-13 ENCOUNTER — Other Ambulatory Visit: Payer: Self-pay | Admitting: Family Medicine

## 2021-01-15 ENCOUNTER — Telehealth: Payer: Self-pay

## 2021-01-15 NOTE — Telephone Encounter (Signed)
The patient dropped off forms for work at her last visit. These have been completed. I called and left a detailed message on her verified voice mail to let her know this was ready for pick up.

## 2021-02-10 ENCOUNTER — Other Ambulatory Visit: Payer: Self-pay | Admitting: Family Medicine

## 2021-03-02 ENCOUNTER — Ambulatory Visit
Admission: RE | Admit: 2021-03-02 | Discharge: 2021-03-02 | Disposition: A | Discharge status: Home / Self Care | Payer: BC Managed Care – PPO | Source: Ambulatory Visit | Attending: Family Medicine | Admitting: Family Medicine

## 2021-03-02 ENCOUNTER — Other Ambulatory Visit: Payer: Self-pay

## 2021-03-02 DIAGNOSIS — Z1231 Encounter for screening mammogram for malignant neoplasm of breast: Secondary | ICD-10-CM

## 2021-03-04 ENCOUNTER — Ambulatory Visit
Admission: EM | Admit: 2021-03-04 | Discharge: 2021-03-04 | Disposition: A | Discharge status: Home / Self Care | Payer: BC Managed Care – PPO | Attending: Urgent Care | Admitting: Urgent Care

## 2021-03-04 ENCOUNTER — Other Ambulatory Visit: Payer: Self-pay

## 2021-03-04 ENCOUNTER — Encounter: Payer: Self-pay | Admitting: Emergency Medicine

## 2021-03-04 DIAGNOSIS — J069 Acute upper respiratory infection, unspecified: Secondary | ICD-10-CM

## 2021-03-04 MED ORDER — CETIRIZINE HCL 10 MG PO TABS: 10.0000 mg | ORAL_TABLET | Freq: Every day | ORAL | 0 refills | Status: AC

## 2021-03-04 MED ORDER — PROMETHAZINE-DM 6.25-15 MG/5ML PO SYRP: 5.0000 mL | ORAL_SOLUTION | Freq: Every evening | ORAL | 0 refills | Status: DC | PRN

## 2021-03-04 MED ORDER — PSEUDOEPHEDRINE HCL 30 MG PO TABS: 30.0000 mg | ORAL_TABLET | Freq: Three times a day (TID) | ORAL | 0 refills | Status: AC | PRN

## 2021-03-04 MED ORDER — BENZONATATE 100 MG PO CAPS: 100.0000 mg | ORAL_CAPSULE | Freq: Three times a day (TID) | ORAL | 0 refills | Status: DC | PRN

## 2021-03-04 NOTE — Discharge Instructions (Addendum)

## 2021-03-04 NOTE — ED Triage Notes (Signed)
Pt here for cough and nasal congestion with sore throat x 2 days

## 2021-03-05 LAB — SARS-COV-2, NAA 2 DAY TAT

## 2021-03-05 LAB — NOVEL CORONAVIRUS, NAA: SARS-CoV-2, NAA: NOT DETECTED

## 2021-03-07 ENCOUNTER — Other Ambulatory Visit: Payer: Self-pay

## 2021-03-07 ENCOUNTER — Ambulatory Visit: Payer: BC Managed Care – PPO | Admitting: Family Medicine

## 2021-03-07 VITALS — BP 140/84 | HR 73 | Temp 98.3°F | Wt 198.9 lb

## 2021-03-07 DIAGNOSIS — J069 Acute upper respiratory infection, unspecified: Secondary | ICD-10-CM | POA: Diagnosis not present

## 2021-03-07 MED ORDER — HYDROCODONE BIT-HOMATROP MBR 5-1.5 MG/5ML PO SOLN: 5.0000 mL | Freq: Four times a day (QID) | ORAL | 0 refills | Status: DC | PRN

## 2021-03-07 NOTE — Progress Notes (Signed)
Established Patient Office Visit  Subjective:  Patient ID: Carla Williams, female    DOB: Jul 20, 1965  Age: 55 y.o. MRN: [id number]  CC:  Chief Complaint  Patient presents with   Follow-up    Congestion, productive cough, no improvement from ED visit    HPI Carla Williams presents for onset last Saturday of some nasal congestion and cough.  No fever.  She was around a couple grandchildren recently who had similar symptoms.  She actually went to urgent care on Sunday and had COVID test which came back negative.  Her grandsons likewise tested negative.  Her cough has been her main symptom.  She was prescribed Tessalon Perles and Promethazine DM and neither of helped her cough.  No dyspnea.  No obvious wheezing.  Past Medical History:  Diagnosis Date   Colon polyps    hyperplastic   Diabetes mellitus    GERD (gastroesophageal reflux disease)    Hyperlipemia    Hypertension    Obesity     Past Surgical History:  Procedure Laterality Date   TUBAL LIGATION      Family History  Problem Relation Age of Onset   COPD Mother    Diabetes Father        and Mother   Heart disease Father    COPD Brother    Breast cancer Other        Maternal Great Aunt    Colon cancer Neg Hx    Stomach cancer Neg Hx    Esophageal cancer Neg Hx     Social History   Socioeconomic History   Marital status: Married    Spouse name: Not on file   Number of children: 2   Years of education: Not on file   Highest education level: Not on file  Occupational History   Occupation: Education officer, museum  Tobacco Use   Smoking status: Never   Smokeless tobacco: Never  Vaping Use   Vaping Use: Never used  Substance and Sexual Activity   Alcohol use: No   Drug use: No   Sexual activity: Not on file  Other Topics Concern   Not on file  Social History Narrative   One caffeine drink daily    Social Determinants of Health   Financial Resource Strain: Not on file  Food Insecurity: Not on file   Transportation Needs: Not on file  Physical Activity: Not on file  Stress: Not on file  Social Connections: Not on file  Intimate Partner Violence: Not on file    Outpatient Medications Prior to Visit  Medication Sig Dispense Refill   atorvastatin (LIPITOR) 40 MG tablet TAKE 1 TABLET (40 MG TOTAL) BY MOUTH DAILY. ONE TABLET BY MOUTH ONCE DAILY 90 tablet 1   benazepril-hydrochlorthiazide (LOTENSIN HCT) 10-12.5 MG tablet TAKE 1 TABLET BY MOUTH EVERY DAY 30 tablet 5   benzonatate (TESSALON) 100 MG capsule Take 1-2 capsules (100-200 mg total) by mouth 3 (three) times daily as needed for cough. 60 capsule 0   cetirizine (ZYRTEC ALLERGY) 10 MG tablet Take 1 tablet (10 mg total) by mouth daily. 30 tablet 0   Dapagliflozin-metFORMIN HCl ER 5-500 MG TB24 Take 1 tablet by mouth 2 (two) times daily.     esomeprazole (NEXIUM) 40 MG capsule TAKE 1 CAPSULE BY MOUTH EVERY DAY 30 capsule 1   glimepiride (AMARYL) 4 MG tablet Take 1 tablet (4 mg total) by mouth daily before breakfast. 30 tablet 3   ibuprofen (ADVIL) 800 MG tablet TAKE  1 TABLET (800 MG TOTAL) BY MOUTH AS NEEDED. 30 tablet 1   Insulin Pen Needle (NOVOTWIST) 32G X 5 MM MISC Use one per day to inject Victoza 50 each 3   LORazepam (ATIVAN) 0.5 MG tablet Take 1 tablet (0.5 mg total) by mouth at bedtime as needed for anxiety. 10 tablet 0   mupirocin ointment (BACTROBAN) 2 % Use as needed on open sore up to TID 30 g 1   ONETOUCH DELICA LANCETS 99991111 MISC Use to check blood sugar 2 times per day dx code E11.9 100 each 3   ONETOUCH VERIO test strip USE AS INSTRUCTED TO CHECK BLOOD SUGAR 2 TIMES PER DAY DX CODE E11.9 100 each 3   OZEMPIC, 0.25 OR 0.5 MG/DOSE, 2 MG/1.5ML SOPN Inject 0.25 mg into the skin once a week.     pseudoephedrine (SUDAFED) 30 MG tablet Take 1 tablet (30 mg total) by mouth every 8 (eight) hours as needed for congestion. 30 tablet 0   TRESIBA FLEXTOUCH 100 UNIT/ML FlexTouch Pen 10U ONCE A DAY SUBCUTANEOUS 30 DAYS      promethazine-dextromethorphan (PROMETHAZINE-DM) 6.25-15 MG/5ML syrup Take 5 mLs by mouth at bedtime as needed for cough. 100 mL 0   No facility-administered medications prior to visit.    No Known Allergies  ROS Review of Systems  Constitutional:  Negative for chills and fever.  HENT:  Positive for congestion.   Respiratory:  Positive for cough. Negative for shortness of breath and wheezing.      Objective:    Physical Exam Vitals reviewed.  Constitutional:      Appearance: Normal appearance.  HENT:     Ears:     Comments: Eardrums not fully visualized secondary to cerumen Cardiovascular:     Rate and Rhythm: Normal rate and regular rhythm.  Pulmonary:     Effort: Pulmonary effort is normal.     Breath sounds: Normal breath sounds. No wheezing or rales.  Neurological:     Mental Status: She is alert.    BP 140/84 (BP Location: Left Arm, Patient Position: Sitting, Cuff Size: Normal)   Pulse 73   Temp 98.3 F (36.8 C) (Oral)   Wt 198 lb 14.4 oz (90.2 kg)   LMP 03/15/2013   SpO2 98%   BMI 33.61 kg/m  Wt Readings from Last 3 Encounters:  03/07/21 198 lb 14.4 oz (90.2 kg)  01/10/21 201 lb 9.6 oz (91.4 kg)  03/03/20 208 lb 6 oz (94.5 kg)     Health Maintenance Due  Topic Date Due   HIV Screening  Never done   Zoster Vaccines- Shingrix (1 of 2) Never done   HEMOGLOBIN A1C  03/01/2020   COVID-19 Vaccine (4 - Booster for Pfizer series) 09/20/2020   FOOT EXAM  12/28/2020   INFLUENZA VACCINE  01/29/2021   OPHTHALMOLOGY EXAM  02/13/2021    There are no preventive care reminders to display for this patient.  Lab Results  Component Value Date   TSH 1.29 01/10/2021   Lab Results  Component Value Date   WBC 4.9 01/10/2021   HGB 14.6 01/10/2021   HCT 43.3 01/10/2021   MCV 90.1 01/10/2021   PLT 232.0 01/10/2021   Lab Results  Component Value Date   NA 142 01/10/2021   K 4.1 01/10/2021   CO2 28 01/10/2021   GLUCOSE 107 (H) 01/10/2021   BUN 11 01/10/2021    CREATININE 0.77 01/10/2021   BILITOT 0.9 01/10/2021   ALKPHOS 60 01/10/2021   AST 21  01/10/2021   ALT 37 (H) 01/10/2021   PROT 7.4 01/10/2021   ALBUMIN 4.7 01/10/2021   CALCIUM 9.8 01/10/2021   GFR 86.69 01/10/2021   Lab Results  Component Value Date   CHOL 159 01/10/2021   Lab Results  Component Value Date   HDL 54.20 01/10/2021   Lab Results  Component Value Date   LDLCALC 91 01/10/2021   Lab Results  Component Value Date   TRIG 68.0 01/10/2021   Lab Results  Component Value Date   CHOLHDL 3 01/10/2021   Lab Results  Component Value Date   HGBA1C 7.2 09/08/2017      Assessment & Plan:   Problem List Items Addressed This Visit   None Visit Diagnoses     Viral URI with cough    -  Primary     Suspect viral URI.  Nonfocal exam.  Cough not relieved with Tessalon or promethazine dextromethorphan  -Wrote for limited Hycodan cough syrup 1 teaspoon every 6 hours as needed for severe cough -Follow-up immediately for any dyspnea, fever or other concerns  Meds ordered this encounter  Medications   HYDROcodone bit-homatropine (HYCODAN) 5-1.5 MG/5ML syrup    Sig: Take 5 mLs by mouth every 6 (six) hours as needed for cough.    Dispense:  120 mL    Refill:  0    Follow-up: No follow-ups on file.    Carolann Littler, MD

## 2021-05-17 ENCOUNTER — Other Ambulatory Visit: Payer: Self-pay | Admitting: Family Medicine

## 2021-08-13 ENCOUNTER — Other Ambulatory Visit: Payer: Self-pay | Admitting: Family Medicine

## 2021-08-30 ENCOUNTER — Other Ambulatory Visit: Payer: Self-pay | Admitting: Family Medicine

## 2021-10-12 ENCOUNTER — Other Ambulatory Visit: Payer: Self-pay | Admitting: Family Medicine

## 2022-01-09 ENCOUNTER — Other Ambulatory Visit: Payer: Self-pay | Admitting: Family Medicine

## 2022-01-11 ENCOUNTER — Encounter: Payer: Self-pay | Admitting: Family Medicine

## 2022-01-11 ENCOUNTER — Ambulatory Visit (INDEPENDENT_AMBULATORY_CARE_PROVIDER_SITE_OTHER): Payer: BC Managed Care – PPO | Admitting: Family Medicine

## 2022-01-11 VITALS — BP 140/80 | HR 68 | Temp 97.9°F | Ht 66.54 in | Wt 196.1 lb

## 2022-01-11 DIAGNOSIS — Z0000 Encounter for general adult medical examination without abnormal findings: Secondary | ICD-10-CM

## 2022-01-11 DIAGNOSIS — E669 Obesity, unspecified: Secondary | ICD-10-CM

## 2022-01-11 DIAGNOSIS — I10 Essential (primary) hypertension: Secondary | ICD-10-CM

## 2022-01-11 DIAGNOSIS — E785 Hyperlipidemia, unspecified: Secondary | ICD-10-CM | POA: Diagnosis not present

## 2022-01-11 DIAGNOSIS — E119 Type 2 diabetes mellitus without complications: Secondary | ICD-10-CM | POA: Diagnosis not present

## 2022-01-11 LAB — BASIC METABOLIC PANEL
BUN: 14 mg/dL (ref 6–23)
CO2: 28 mEq/L (ref 19–32)
Calcium: 9.6 mg/dL (ref 8.4–10.5)
Chloride: 103 mEq/L (ref 96–112)
Creatinine, Ser: 0.77 mg/dL (ref 0.40–1.20)
GFR: 86.08 mL/min (ref >60.00)
Glucose, Bld: 112 mg/dL — ABNORMAL HIGH (ref 70–99)
Potassium: 3.9 mEq/L (ref 3.5–5.1)
Sodium: 141 mEq/L (ref 135–145)

## 2022-01-11 LAB — HEPATIC FUNCTION PANEL
ALT: 34 U/L (ref 0–35)
AST: 23 U/L (ref 0–37)
Albumin: 4.6 g/dL (ref 3.5–5.2)
Alkaline Phosphatase: 49 U/L (ref 39–117)
Bilirubin, Direct: 0.1 mg/dL (ref 0.0–0.3)
Total Bilirubin: 0.8 mg/dL (ref 0.2–1.2)
Total Protein: 7.5 g/dL (ref 6.0–8.3)

## 2022-01-11 LAB — CBC WITH DIFFERENTIAL/PLATELET
Basophils Absolute: 0 10*3/uL (ref 0.0–0.1)
Basophils Relative: 0.4 % (ref 0.0–3.0)
Eosinophils Absolute: 0.1 10*3/uL (ref 0.0–0.7)
Eosinophils Relative: 2 % (ref 0.0–5.0)
HCT: 42.5 % (ref 36.0–46.0)
Hemoglobin: 13.9 g/dL (ref 12.0–15.0)
Lymphocytes Relative: 52.2 % — ABNORMAL HIGH (ref 12.0–46.0)
Lymphs Abs: 2.3 10*3/uL (ref 0.7–4.0)
MCHC: 32.8 g/dL (ref 30.0–36.0)
MCV: 92.7 fl (ref 78.0–100.0)
Monocytes Absolute: 0.4 10*3/uL (ref 0.1–1.0)
Monocytes Relative: 9.2 % (ref 3.0–12.0)
Neutro Abs: 1.6 10*3/uL (ref 1.4–7.7)
Neutrophils Relative %: 36.2 % — ABNORMAL LOW (ref 43.0–77.0)
Platelets: 257 10*3/uL (ref 150.0–400.0)
RBC: 4.59 Mil/uL (ref 3.87–5.11)
RDW: 13.4 % (ref 11.5–15.5)
WBC: 4.5 10*3/uL (ref 4.0–10.5)

## 2022-01-11 LAB — LIPID PANEL
Cholesterol: 160 mg/dL (ref 0–200)
HDL: 55.2 mg/dL (ref >39.00)
LDL Cholesterol: 90 mg/dL (ref 0–99)
NonHDL: 104.99
Total CHOL/HDL Ratio: 3
Triglycerides: 77 mg/dL (ref 0.0–149.0)
VLDL: 15.4 mg/dL (ref 0.0–40.0)

## 2022-01-11 LAB — TSH: TSH: 1.45 u[IU]/mL (ref 0.35–5.50)

## 2022-01-11 NOTE — Progress Notes (Signed)
Established Patient Office Visit  Subjective   Patient ID: Carla Williams, female    DOB: 1966-06-16  Age: 56 y.o. MRN: [id number]  Chief Complaint  Patient presents with   Annual Exam    HPI   Carla Williams has history of hypertension, GERD, type 2 diabetes, dyslipidemia.  Followed by endocrinology.  Now takes [facility].  She is lost some weight with this.  Overall feels well.  Her only issue is she has had some ongoing GERD symptoms in spite of Nexium and supplementation with H2 blocker.  She is in process of setting up GI follow-up.  She sees GYN regularly and gets Pap smears and mammograms through them.  Her colonoscopy is up-to-date.  No history of Shingrix vaccine.  Health Maintenance  Topic Date Due   HIV Screening  Never done   Zoster Vaccines- Shingrix (1 of 2) Never done   COVID-19 Vaccine (4 - Pfizer series) 07/18/2020   INFLUENZA VACCINE  01/29/2022   HEMOGLOBIN A1C  03/08/2022   OPHTHALMOLOGY EXAM  10/03/2022   FOOT EXAM  01/12/2023   MAMMOGRAM  03/03/2023   PAP SMEAR-Modifier  01/04/2024   COLONOSCOPY (Pts 45-14yr Insurance coverage will need to be confirmed)  08/01/2025   TETANUS/TDAP  09/08/2026   Hepatitis C Screening  Completed   HPV VACCINES  Aged Out      Family history and social history reviewed:  Family history-Father had coronary disease in his 445sand has diabetes type 2.  Mother had history of COPD and died of what sounds like COVID complications.  She has 1 brother and 1 sister.  1 brother with COPD history.  He is a smoker also   Social history-she is married and has 1 daughter.  4 grandchildren.  1 son.  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  Rare alcohol use.  Past Medical History:  Diagnosis Date   Colon polyps    hyperplastic   Diabetes mellitus    GERD (gastroesophageal reflux disease)    Hyperlipemia    Hypertension    Obesity    Past Surgical History:  Procedure Laterality Date   TUBAL  LIGATION      reports that she has never smoked. She has never used smokeless tobacco. She reports that she does not drink alcohol and does not use drugs. family history includes Breast cancer in an other family member; COPD in her brother and mother; Diabetes in her father; Heart disease in her father. No Known Allergies  Review of Systems  Constitutional:  Negative for chills, fever, malaise/fatigue and weight loss.  HENT:  Negative for hearing loss.   Eyes:  Negative for blurred vision and double vision.  Respiratory:  Negative for cough and shortness of breath.   Cardiovascular:  Negative for chest pain, palpitations and leg swelling.  Gastrointestinal:  Negative for abdominal pain, blood in stool, constipation and diarrhea.  Genitourinary:  Negative for dysuria.  Skin:  Negative for rash.  Neurological:  Negative for dizziness, speech change, seizures, loss of consciousness and headaches.  Psychiatric/Behavioral:  Negative for depression.       Objective:     BP 140/80 (BP Location: Left Arm, Patient Position: Sitting, Cuff Size: Normal)   Pulse 68   Temp 97.9 F (36.6 C) (Oral)   Ht 5' 6.54" (1.69 m)   Wt 196 lb 1.6 oz (89 kg)   LMP 03/15/2013   SpO2 99%   BMI 31.14 kg/m  BP Readings from Last 3 Encounters:  01/11/22 140/80  03/07/21 140/84  03/04/21 (!) 139/92   Wt Readings from Last 3 Encounters:  01/11/22 196 lb 1.6 oz (89 kg)  03/07/21 198 lb 14.4 oz (90.2 kg)  01/10/21 201 lb 9.6 oz (91.4 kg)      Physical Exam Constitutional:      Appearance: She is well-developed.  HENT:     Head: Normocephalic and atraumatic.  Eyes:     Pupils: Pupils are equal, round, and reactive to light.  Neck:     Thyroid: No thyromegaly.  Cardiovascular:     Rate and Rhythm: Normal rate and regular rhythm.     Heart sounds: Normal heart sounds. No murmur heard. Pulmonary:     Effort: No respiratory distress.     Breath sounds: Normal breath sounds. No wheezing or rales.   Abdominal:     General: Bowel sounds are normal. There is no distension.     Palpations: Abdomen is soft. There is no mass.     Tenderness: There is no abdominal tenderness. There is no guarding or rebound.  Musculoskeletal:        General: Normal range of motion.     Cervical back: Normal range of motion and neck supple.     Right lower leg: No edema.     Left lower leg: No edema.  Lymphadenopathy:     Cervical: No cervical adenopathy.  Skin:    Findings: No rash.  Neurological:     Mental Status: She is alert and oriented to person, place, and time.     Cranial Nerves: No cranial nerve deficit.  Psychiatric:        Behavior: Behavior normal.        Thought Content: Thought content normal.        Judgment: Judgment normal.      No results found for any visits on 01/11/22.    The 10-year ASCVD risk score (Arnett DK, et al., 2019) is: 13.4%    Assessment & Plan:   Problem List Items Addressed This Visit   None Visit Diagnoses     Physical exam    -  Primary   Relevant Orders   Basic metabolic panel   Lipid panel   CBC with Differential/Platelet   TSH   Hepatic function panel     Blood pressure was up slightly today but generally well controlled by home readings.  Continue close monitoring.  Positive family history of premature CAD in father.  We did discuss possible coronary calcium score for further risk ratification and she will consider.  Recommend Shingrix vaccine.  She declines today but will schedule.  Obtain screening labs as above  Continue annual flu vaccine  No follow-ups on file.    Carolann Littler, MD

## 2022-01-11 NOTE — Patient Instructions (Signed)
Consider Shingrix vaccine.

## 2022-01-18 ENCOUNTER — Ambulatory Visit: Payer: BC Managed Care – PPO

## 2022-01-29 ENCOUNTER — Ambulatory Visit (INDEPENDENT_AMBULATORY_CARE_PROVIDER_SITE_OTHER): Payer: BC Managed Care – PPO | Admitting: *Deleted

## 2022-01-29 DIAGNOSIS — Z23 Encounter for immunization: Secondary | ICD-10-CM

## 2022-03-06 ENCOUNTER — Other Ambulatory Visit: Payer: Self-pay | Admitting: Family Medicine

## 2022-03-06 DIAGNOSIS — Z1231 Encounter for screening mammogram for malignant neoplasm of breast: Secondary | ICD-10-CM

## 2022-03-11 ENCOUNTER — Other Ambulatory Visit: Payer: Self-pay | Admitting: Family Medicine

## 2022-04-03 ENCOUNTER — Ambulatory Visit: Payer: BC Managed Care – PPO

## 2022-04-14 ENCOUNTER — Other Ambulatory Visit: Payer: Self-pay | Admitting: Family Medicine

## 2022-04-15 NOTE — Telephone Encounter (Signed)
Patient currently prescribed Esomeprazole 40 mg, which cost patient $16 per month.  Pharmacy requesting alternative, Omeprazole, will cost patient $3 per month.   See message below from pharmacy.   Please advise  Pharmacy comment: Alternative Requested:PT PAYING $16 FOR ESOME. PT REQUESTING LOWER-COST ALTERNATIVE: OMEPR. $3;

## 2022-04-16 ENCOUNTER — Telehealth: Payer: Self-pay

## 2022-04-16 NOTE — Telephone Encounter (Signed)
Receive change of medication electronically from pharmacy requesting change from Esomeprazole to Omeprazole due to cheaper monthly cost for patient.   Omeprazole 20 mg sent to [facility] on [address].    Lvm for patient per Dr. Elease Hashimoto if any problems with new med to contact office.

## 2022-04-17 ENCOUNTER — Other Ambulatory Visit: Payer: Self-pay | Admitting: Family Medicine

## 2022-04-28 ENCOUNTER — Ambulatory Visit
Admission: EM | Admit: 2022-04-28 | Discharge: 2022-04-28 | Disposition: A | Discharge status: Home / Self Care | Payer: BC Managed Care – PPO | Attending: Emergency Medicine | Admitting: Emergency Medicine

## 2022-04-28 DIAGNOSIS — N3001 Acute cystitis with hematuria: Secondary | ICD-10-CM | POA: Diagnosis present

## 2022-04-28 LAB — POCT URINALYSIS DIP (MANUAL ENTRY)
Bilirubin, UA: NEGATIVE
Glucose, UA: >=1000 mg/dL — AB
Ketones, POC UA: NEGATIVE mg/dL
Leukocytes, UA: NEGATIVE
Nitrite, UA: NEGATIVE
Protein Ur, POC: 100 mg/dL — AB
Spec Grav, UA: >=1.03 — AB (ref 1.010–1.025)
Urobilinogen, UA: 0.2 E.U./dL
pH, UA: 6 (ref 5.0–8.0)

## 2022-04-28 MED ORDER — SULFAMETHOXAZOLE-TRIMETHOPRIM 800-160 MG PO TABS: 1.0000 | ORAL_TABLET | Freq: Two times a day (BID) | ORAL | 0 refills | Status: AC

## 2022-04-28 MED ORDER — FLUCONAZOLE 150 MG PO TABS: ORAL_TABLET | ORAL | 0 refills | Status: DC

## 2022-04-28 NOTE — ED Provider Notes (Signed)
[facility]    CSN: [id number] Arrival date & time: 04/28/22  1126    HISTORY   Chief Complaint  Patient presents with   Urinary Frequency   HPI Carla Williams is a pleasant, 56 y.o. female who presents to urgent care today. Pt presents with urinary frequency and urgency since waking up this morning.  States she ate Janine Limbo late last night.  Patient states she has a reasonably well controlled type II diabetic with a very good A1c up until she was recently forced to switch from [place] to Ozempic due to changes in her health insurance plan.  Patient states she has not had her A1c checked since switching.    Past Medical History:  Diagnosis Date   Colon polyps    hyperplastic   Diabetes mellitus    GERD (gastroesophageal reflux disease)    Hyperlipemia    Hypertension    Obesity    Patient Active Problem List   Diagnosis Date Noted   Body aches 07/02/2018   Pain of left shoulder joint on movement 12/05/2017   Vertigo 09/06/2017   Acute upper respiratory infection 09/06/2017   Nausea 09/06/2017   Diabetes mellitus without complication ([place]) 14/78/2956   Dyslipidemia 02/15/2013   Obesity, unspecified 02/15/2013   GERD (gastroesophageal reflux disease) 02/15/2013   Essential hypertension, benign 02/15/2013   Past Surgical History:  Procedure Laterality Date   TUBAL LIGATION     OB History   No obstetric history on file.    Home Medications    Prior to Admission medications   Medication Sig Start Date End Date Taking? Authorizing Provider  atorvastatin (LIPITOR) 40 MG tablet TAKE 1 TABLET (40 MG TOTAL) BY MOUTH DAILY. ONE TABLET BY MOUTH ONCE DAILY 12/29/19   Burchette, Alinda Sierras, MD  benazepril-hydrochlorthiazide (LOTENSIN HCT) 10-12.5 MG tablet TAKE 1 TABLET BY MOUTH EVERY DAY 04/17/22   Burchette, Alinda Sierras, MD  benzonatate (TESSALON) 100 MG capsule Take 1-2 capsules (100-200 mg total) by mouth 3 (three) times daily as needed for cough. 03/04/21   Jaynee Eagles, PA-C  cetirizine (ZYRTEC ALLERGY) 10 MG tablet Take 1 tablet (10 mg total) by mouth daily. 03/04/21   Jaynee Eagles, PA-C  Dapagliflozin-metFORMIN HCl ER (XIGDUO XR) 03-999 MG TB24 Take 1 tablet by mouth daily.    [provider]  glimepiride (AMARYL) 4 MG tablet Take 1 tablet (4 mg total) by mouth daily before breakfast. 12/25/16   Marletta Lor, MD  HYDROcodone bit-homatropine (HYCODAN) 5-1.5 MG/5ML syrup Take 5 mLs by mouth every 6 (six) hours as needed for cough. 03/07/21   Burchette, Alinda Sierras, MD  ibuprofen (ADVIL) 800 MG tablet TAKE 1 TABLET (800 MG TOTAL) BY MOUTH AS NEEDED. 01/12/21   Burchette, Alinda Sierras, MD  Insulin Pen Needle (NOVOTWIST) 32G X 5 MM MISC Use one per day to inject Victoza 07/08/14   Elayne Snare, MD  LORazepam (ATIVAN) 0.5 MG tablet Take 1 tablet (0.5 mg total) by mouth at bedtime as needed for anxiety. 12/29/19   Burchette, Alinda Sierras, MD  mupirocin ointment (BACTROBAN) 2 % USE AS NEEDED ON OPEN SORE UP TO 3 TIMES DAILY 05/18/21   Burchette, Alinda Sierras, MD  omeprazole (PRILOSEC) 20 MG capsule Take 1 tablet by mouth daily. 04/16/22   Burchette, Alinda Sierras, MD  ONETOUCH DELICA LANCETS 21H MISC Use to check blood sugar 2 times per day dx code E11.9 08/29/15   Elayne Snare, MD  [facility] VERIO test strip USE AS INSTRUCTED  TO CHECK BLOOD SUGAR 2 TIMES PER DAY DX CODE E11.9 08/28/15   Elayne Snare, MD  pseudoephedrine (SUDAFED) 30 MG tablet Take 1 tablet (30 mg total) by mouth every 8 (eight) hours as needed for congestion. 03/04/21   Jaynee Eagles, PA-C  tirzepatide [facility]) 10 MG/0.5ML Pen Inject 10 mg into the skin once a week.    [provider]    Family History Family History  Problem Relation Age of Onset   COPD Mother    Diabetes Father        and Mother   Heart disease Father    COPD Brother    Breast cancer Other        Maternal Great Aunt    Colon cancer Neg Hx    Stomach cancer Neg Hx    Esophageal cancer Neg Hx    Social History Social History    Tobacco Use   Smoking status: Never   Smokeless tobacco: Never  Vaping Use   Vaping Use: Never used  Substance Use Topics   Alcohol use: No   Drug use: No   Allergies   Patient has no known allergies.  Review of Systems Review of Systems Pertinent findings revealed after performing a 14 point review of systems has been noted in the history of present illness.  Physical Exam Triage Vital Signs ED Triage Vitals  Enc Vitals Group     BP 04/27/21 0827 (!) 147/82     Pulse Rate 04/27/21 0827 72     Resp 04/27/21 0827 18     Temp 04/27/21 0827 98.3 F (36.8 C)     Temp Source 04/27/21 0827 Oral     SpO2 04/27/21 0827 98 %     Weight --      Height --      Head Circumference --      Peak Flow --      Pain Score 04/27/21 0826 5     Pain Loc --      Pain Edu? --      Excl. in [place]? --   No data found.  Updated Vital Signs BP (!) 148/89 (BP Location: Left Arm)   Pulse 77   Temp 98 F (36.7 C) (Oral)   Resp 18   LMP 03/15/2013   SpO2 98%   Physical Exam Vitals and nursing note reviewed.  Constitutional:      General: She is not in acute distress.    Appearance: Normal appearance. She is not ill-appearing.  HENT:     Head: Normocephalic and atraumatic.  Eyes:     General: Lids are normal.        Right eye: No discharge.        Left eye: No discharge.     Extraocular Movements: Extraocular movements intact.     Conjunctiva/sclera: Conjunctivae normal.     Right eye: Right conjunctiva is not injected.     Left eye: Left conjunctiva is not injected.  Neck:     Trachea: Trachea and phonation normal.  Cardiovascular:     Rate and Rhythm: Normal rate and regular rhythm.     Pulses: Normal pulses.     Heart sounds: Normal heart sounds. No murmur heard.    No friction rub. No gallop.  Pulmonary:     Effort: Pulmonary effort is normal. No accessory muscle usage, prolonged expiration or respiratory distress.     Breath sounds: Normal breath sounds. No stridor,  decreased air movement or transmitted upper  airway sounds. No decreased breath sounds, wheezing, rhonchi or rales.  Chest:     Chest wall: No tenderness.  Abdominal:     General: Abdomen is flat. Bowel sounds are normal. There is no distension.     Palpations: Abdomen is soft.     Tenderness: There is no abdominal tenderness. There is no right CVA tenderness or left CVA tenderness.     Hernia: No hernia is present.  Musculoskeletal:        General: Normal range of motion.     Cervical back: Normal range of motion and neck supple. Normal range of motion.  Lymphadenopathy:     Cervical: No cervical adenopathy.  Skin:    General: Skin is warm and dry.     Findings: No erythema or rash.  Neurological:     General: No focal deficit present.     Mental Status: She is alert and oriented to person, place, and time.  Psychiatric:        Mood and Affect: Mood normal.        Behavior: Behavior normal.     Visual Acuity Right Eye Distance:   Left Eye Distance:   Bilateral Distance:    Right Eye Near:   Left Eye Near:    Bilateral Near:     UC Couse / Diagnostics / Procedures:     Radiology No results found.  Procedures Procedures (including critical care time) EKG  Pending results:  Labs Reviewed  POCT URINALYSIS DIP (MANUAL ENTRY) - Abnormal; Notable for the following components:      Result Value   Color, UA other (*)    Clarity, UA cloudy (*)    Glucose, UA >=1,000 (*)    Spec Grav, UA >=1.030 (*)    Blood, UA large (*)    Protein Ur, POC =100 (*)    All other components within normal limits  URINE CULTURE    Medications Ordered in UC: Medications - No data to display  UC Diagnoses / Final Clinical Impressions(s)   I have reviewed the triage vital signs and the nursing notes.  Pertinent labs & imaging results that were available during my care of the patient were reviewed by me and considered in my medical decision making (see chart for details).    Final  diagnoses:  Acute cystitis with hematuria   Patient was advised to begin antibiotics now due to findings on urine dip. Patient was advised to begin antibiotics today due to having active symptoms of urinary tract infection.                    Patient was advised to take all doses exactly as prescribed.  Patient also advised of risks of worsening infection with incomplete antibiotic therapy. Diflucan prescribed for inevitable vaginal yeast infection caused by antibiotic therapy. Return precautions advised.  ED Prescriptions     Medication Sig Dispense Auth. Provider   sulfamethoxazole-trimethoprim (BACTRIM DS) 800-160 MG tablet Take 1 tablet by mouth 2 (two) times daily for 3 days. 6 tablet Lynden Oxford Scales, PA-C   fluconazole (DIFLUCAN) 150 MG tablet Take 1 tablet today.  Take second tablet 3 days later. 2 tablet Lynden Oxford Scales, PA-C      PDMP not reviewed this encounter.  Disposition Upon Discharge:  Condition: stable for discharge home  Patient presented with concern for an acute illness with associated systemic symptoms and significant discomfort requiring urgent management. In my opinion, this is a condition that a prudent  lay person (someone who possesses an average knowledge of health and medicine) may potentially expect to result in complications if not addressed urgently such as respiratory distress, impairment of bodily function or dysfunction of bodily organs.   As such, the patient has been evaluated and assessed, work-up was performed and treatment was provided in alignment with urgent care protocols and evidence based medicine.  Patient/parent/caregiver has been advised that the patient may require follow up for further testing and/or treatment if the symptoms continue in spite of treatment, as clinically indicated and appropriate.  Routine symptom specific, illness specific and/or disease specific instructions were discussed with the patient and/or caregiver at  length.  Prevention strategies for avoiding STD exposure were also discussed.  The patient will follow up with their current PCP if and as advised. If the patient does not currently have a PCP we will assist them in obtaining one.   The patient may need specialty follow up if the symptoms continue, in spite of conservative treatment and management, for further workup, evaluation, consultation and treatment as clinically indicated and appropriate.  Patient/parent/caregiver verbalized understanding and agreement of plan as discussed.  All questions were addressed during visit.  Please see discharge instructions below for further details of plan.  Discharge Instructions:   Discharge Instructions      Common causes of urinary tract infections include but are not limited to holding your urine longer than you should, squatting instead of sitting down when urinating, sitting around in wet clothing such as a wet swimsuit or gym clothes too long, not emptying your bladder after having sexual intercourse, wiping from back to front instead of front to back after having a bowel movement.     The urinalysis that we performed in the clinic today was abnormal.  As I feared, there was a significant amount of sugar in your urine.  Believe that this could be caused by your having been switched recently from [facility] to [place].    Urine culture will be performed per our protocol.  The result of the urine culture will be available in the next 3 to 5 days and will be posted to your MyChart account.  If there is an abnormal finding, you will be contacted by phone and advised of further treatment recommendations, if any.   You were advised to begin antibiotics today because your urinalysis is abnormal and you are having active symptoms of an acute lower urinary tract infection also known as cystitis.  It is very important that you take all doses exactly as prescribed.  Incomplete antibiotic therapy can cause worsening  urinary tract infection that can become aggressive, escape from urinary tract into your bloodstream causing sepsis which will require hospitalization.   Please pick up and begin taking your prescription for Bactrim DS (trimethoprim sulfamethoxazole) as soon as possible.  Please take all doses exactly as prescribed.  You can take this medication with or without food.  This medication is safe to take with your other medications.   If you receive a phone call advising you that your urine culture is negative but you begin to feel better after taking antibiotics for 24 hours, please feel free to complete the full course of antibiotics as they were likely needed and the urine culture result was false.  If your culture is negative and you do not feel any better, please return for repeat evaluation of other possible causes of your symptoms.   Please consider abstaining from sexual intercourse while you are being  treated for urinary tract infection.   Because we know that antibiotic treatment can often cause vaginal yeast infections, I have also provided you with a prescription for fluconazole (Diflucan).  Please take the first tablet the day you begin to experience symptoms of vaginal yeast infection which include vaginal itching and thick white vaginal discharge.  Please take the second tablet three days after the first tablet to ensure resolution of the yeast infection   If you have not had complete resolution of your symptoms after completing treatment as prescribed, please return to urgent care for repeat evaluation or follow-up with your primary care provider.   Thank you for visiting urgent care today.  I appreciate the opportunity to participate in your care.       This office note has been dictated using Museum/gallery curator.  Unfortunately, this method of dictation can sometimes lead to typographical or grammatical errors.  I apologize for your inconvenience in advance if this  occurs.  Please do not hesitate to reach out to me if clarification is needed.       Lynden Oxford Scales, [place] 04/29/22 [phone]

## 2022-04-28 NOTE — ED Triage Notes (Signed)
Pt presents with urinary frequency and urgency since waking up this morning.

## 2022-04-28 NOTE — Discharge Instructions (Signed)
Common causes of urinary tract infections include but are not limited to holding your urine longer than you should, squatting instead of sitting down when urinating, sitting around in wet clothing such as a wet swimsuit or gym clothes too long, not emptying your bladder after having sexual intercourse, wiping from back to front instead of front to back after having a bowel movement.     The urinalysis that we performed in the clinic today was abnormal.  As I feared, there was a significant amount of sugar in your urine.  Believe that this could be caused by your having been switched recently from [facility] to [place].    Urine culture will be performed per our protocol.  The result of the urine culture will be available in the next 3 to 5 days and will be posted to your MyChart account.  If there is an abnormal finding, you will be contacted by phone and advised of further treatment recommendations, if any.   You were advised to begin antibiotics today because your urinalysis is abnormal and you are having active symptoms of an acute lower urinary tract infection also known as cystitis.  It is very important that you take all doses exactly as prescribed.  Incomplete antibiotic therapy can cause worsening urinary tract infection that can become aggressive, escape from urinary tract into your bloodstream causing sepsis which will require hospitalization.   Please pick up and begin taking your prescription for Bactrim DS (trimethoprim sulfamethoxazole) as soon as possible.  Please take all doses exactly as prescribed.  You can take this medication with or without food.  This medication is safe to take with your other medications.   If you receive a phone call advising you that your urine culture is negative but you begin to feel better after taking antibiotics for 24 hours, please feel free to complete the full course of antibiotics as they were likely needed and the urine culture result was false.  If your  culture is negative and you do not feel any better, please return for repeat evaluation of other possible causes of your symptoms.   Please consider abstaining from sexual intercourse while you are being treated for urinary tract infection.   Because we know that antibiotic treatment can often cause vaginal yeast infections, I have also provided you with a prescription for fluconazole (Diflucan).  Please take the first tablet the day you begin to experience symptoms of vaginal yeast infection which include vaginal itching and thick white vaginal discharge.  Please take the second tablet three days after the first tablet to ensure resolution of the yeast infection   If you have not had complete resolution of your symptoms after completing treatment as prescribed, please return to urgent care for repeat evaluation or follow-up with your primary care provider.   Thank you for visiting urgent care today.  I appreciate the opportunity to participate in your care.

## 2022-04-29 LAB — URINE CULTURE: Culture: 10000 — AB

## 2022-04-30 ENCOUNTER — Ambulatory Visit
Admission: RE | Admit: 2022-04-30 | Discharge: 2022-04-30 | Disposition: A | Discharge status: Home / Self Care | Payer: BC Managed Care – PPO | Source: Ambulatory Visit | Attending: Family Medicine | Admitting: Family Medicine

## 2022-04-30 DIAGNOSIS — Z1231 Encounter for screening mammogram for malignant neoplasm of breast: Secondary | ICD-10-CM

## 2022-05-01 ENCOUNTER — Ambulatory Visit: Payer: BC Managed Care – PPO | Admitting: Family Medicine

## 2022-05-01 ENCOUNTER — Encounter: Payer: Self-pay | Admitting: Family Medicine

## 2022-05-01 VITALS — BP 130/70 | HR 64 | Temp 97.6°F | Wt 198.7 lb

## 2022-05-01 DIAGNOSIS — R319 Hematuria, unspecified: Secondary | ICD-10-CM

## 2022-05-01 DIAGNOSIS — R42 Dizziness and giddiness: Secondary | ICD-10-CM | POA: Diagnosis not present

## 2022-05-01 NOTE — Patient Instructions (Signed)
REMEMBER TO GET REPEAT URINALYSIS IN ABOUT 2 WEEKS

## 2022-05-01 NOTE — Progress Notes (Signed)
Established Patient Office Visit  Subjective   Patient ID: Carla Williams, female    DOB: 1965-10-11  Age: 56 y.o. MRN: [id number]  Chief Complaint  Patient presents with   Dizziness    Patient complains of dizziness, x2 days    Nausea    HPI   Carla Williams is seen with vertigo symptoms for past couple days.  She has had some mild nausea but no vomiting.  Dizziness is worse with head movement.  No prior history of vertigo.  Denies any progressive recent headaches.  No sudden hearing changes.  No tinnitus.  Denies any focal weakness or ataxia.  No speech changes.  No swallowing difficulties.  Symptoms seem slightly worse today.  She had recent concern for UTI.  She went to urgent care and urinalysis was done which did show some blood with negative nitrites and negative leukocytes.  She was placed on Septra.  Her culture came back positive group B strep but only 10,000 colonies.  She has not noted any other obvious blood since initial occurrence which prompted her visit.  Denies any burning with urination.  No known history of kidney stones.  No vaginal spotting.  Past Medical History:  Diagnosis Date   Colon polyps    hyperplastic   Diabetes mellitus    GERD (gastroesophageal reflux disease)    Hyperlipemia    Hypertension    Obesity    Past Surgical History:  Procedure Laterality Date   TUBAL LIGATION      reports that she has never smoked. She has never used smokeless tobacco. She reports that she does not drink alcohol and does not use drugs. family history includes Breast cancer in an other family member; COPD in her brother and mother; Diabetes in her father; Heart disease in her father. No Known Allergies  Review of Systems  Constitutional:  Negative for chills and fever.  Eyes:  Negative for blurred vision and double vision.  Respiratory:  Negative for cough and shortness of breath.   Cardiovascular:  Negative for chest pain.  Genitourinary:  Negative for dysuria, flank  pain, frequency and hematuria.  Neurological:  Positive for dizziness. Negative for speech change, focal weakness, seizures, loss of consciousness and headaches.      Objective:     BP 130/70   Pulse 64   Temp 97.6 F (36.4 C)   Wt 198 lb 11.2 oz (90.1 kg)   LMP 03/15/2013   SpO2 98%   BMI 31.56 kg/m    Physical Exam Vitals reviewed.  Constitutional:      General: She is not in acute distress.    Appearance: Normal appearance.  Cardiovascular:     Rate and Rhythm: Normal rate and regular rhythm.     Heart sounds: No murmur heard. Pulmonary:     Effort: Pulmonary effort is normal.     Breath sounds: Normal breath sounds.  Musculoskeletal:     Cervical back: Neck supple.  Neurological:     General: No focal deficit present.     Mental Status: She is alert and oriented to person, place, and time.     Cranial Nerves: No cranial nerve deficit.     Motor: No weakness.     Coordination: Coordination normal.      No results found for any visits on 05/01/22.    The 10-year ASCVD risk score (Arnett DK, et al., 2019) is: 11.8%* (Cholesterol units were assumed)    Assessment & Plan:   #1  2-day history of vertigo.  Suspect probably benign peripheral positional vertigo.  Symptoms were reproducible on exam.  No red flag symptoms or findings on exam.  -Handout on vertigo given along with Epley maneuvers to try at home.  Be in touch if these are not resolving with home exercises next few days.  Follow-up sooner for any worsening or new symptoms  #2 recent hematuria on urinalysis at urgent care.  Urine culture was unremarkable.  We recommend follow-up urinalysis with reflex microscopy in 2 weeks.  If hematuria still noted at that point needs further urologic evaluation.   No follow-ups on file.    Carolann Littler, MD

## 2022-05-06 ENCOUNTER — Telehealth: Payer: Self-pay | Admitting: Family Medicine

## 2022-05-06 MED ORDER — ESOMEPRAZOLE MAGNESIUM 40 MG PO CPDR: 40.0000 mg | DELAYED_RELEASE_CAPSULE | Freq: Every day | ORAL | 0 refills | Status: DC

## 2022-05-06 NOTE — Telephone Encounter (Signed)
Patient informed of the message and rx sent

## 2022-05-06 NOTE — Telephone Encounter (Signed)
Pt called to say she wants to continue with the Nexium and does not want the Prilosec. Pt states she does not know why her Rx was changed.   Pt is asking that MD please send a new prescription for the Nexium to:  [id number] [place] [facility] [address] Phone: [phone] Fax: [phone]

## 2022-05-22 ENCOUNTER — Other Ambulatory Visit: Payer: BC Managed Care – PPO

## 2022-05-22 ENCOUNTER — Ambulatory Visit (INDEPENDENT_AMBULATORY_CARE_PROVIDER_SITE_OTHER): Payer: BC Managed Care – PPO

## 2022-05-22 DIAGNOSIS — Z23 Encounter for immunization: Secondary | ICD-10-CM

## 2022-05-22 DIAGNOSIS — R319 Hematuria, unspecified: Secondary | ICD-10-CM

## 2022-05-22 LAB — TIQ-MISC

## 2022-06-26 ENCOUNTER — Telehealth: Payer: Self-pay | Admitting: Family Medicine

## 2022-06-26 NOTE — Telephone Encounter (Signed)
Pt received a bill for her 01/11/22 visit for a CPE   Pt is being charged $55 for "Assay Thyroid Stem Hormone "  Pt called the billing department and was told this was not covered by her insurance (BC/BS).  Pt would like to verify that this test was done on that day and would also like to know why, because it was never discussed during visit.  Pt is asking for a call back to discuss.

## 2022-07-29 ENCOUNTER — Other Ambulatory Visit (HOSPITAL_COMMUNITY): Payer: Self-pay

## 2022-07-29 MED ORDER — MOUNJARO 5 MG/0.5ML ~~LOC~~ SOAJ
5.0000 mg | SUBCUTANEOUS | 5 refills | Status: DC
  Filled 2022-07-29: qty 2, 28d supply, fill #0
  Filled 2022-08-26: qty 2, 28d supply, fill #1

## 2022-08-03 ENCOUNTER — Other Ambulatory Visit: Payer: Self-pay | Admitting: Family Medicine

## 2022-08-05 MED ORDER — IBUPROFEN 800 MG PO TABS: 800.0000 mg | ORAL_TABLET | ORAL | 1 refills | Status: DC | PRN

## 2022-08-05 NOTE — Addendum Note (Signed)
Addended by: Nilda Riggs on: 08/05/2022 04:43 PM   Modules accepted: Orders

## 2022-08-26 ENCOUNTER — Other Ambulatory Visit (HOSPITAL_COMMUNITY): Payer: Self-pay

## 2022-09-10 ENCOUNTER — Other Ambulatory Visit (HOSPITAL_COMMUNITY): Payer: Self-pay

## 2022-09-10 MED ORDER — MOUNJARO 5 MG/0.5ML ~~LOC~~ SOAJ
5.0000 mg | SUBCUTANEOUS | 3 refills | Status: DC
  Filled 2022-09-10: qty 6, 84d supply, fill #0

## 2022-09-20 ENCOUNTER — Other Ambulatory Visit (HOSPITAL_COMMUNITY): Payer: Self-pay

## 2022-09-23 ENCOUNTER — Other Ambulatory Visit (HOSPITAL_COMMUNITY): Payer: Self-pay

## 2022-09-23 MED ORDER — MOUNJARO 7.5 MG/0.5ML ~~LOC~~ SOAJ
7.5000 mg | SUBCUTANEOUS | 1 refills | Status: DC
  Filled 2022-09-23: qty 2, 28d supply, fill #0

## 2022-10-03 ENCOUNTER — Other Ambulatory Visit: Payer: Self-pay | Admitting: Family Medicine

## 2022-10-15 ENCOUNTER — Other Ambulatory Visit (HOSPITAL_COMMUNITY): Payer: Self-pay

## 2022-10-16 ENCOUNTER — Other Ambulatory Visit (HOSPITAL_BASED_OUTPATIENT_CLINIC_OR_DEPARTMENT_OTHER): Payer: Self-pay

## 2022-10-16 ENCOUNTER — Other Ambulatory Visit (HOSPITAL_COMMUNITY): Payer: Self-pay

## 2022-11-06 LAB — HM DIABETES EYE EXAM

## 2022-11-14 ENCOUNTER — Telehealth: Payer: Self-pay | Admitting: Family Medicine

## 2022-11-14 NOTE — Telephone Encounter (Signed)
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   Pt would like to know if she should be on antibiotics and can someone send her an Rx to her pharmacy.  Pt was informed that MD will not prescribe antibiotics without an OV.  Pt requested her message sent back, "just to see" what provider says.  Pt informed MD is OOO until June 2024.

## 2022-11-14 NOTE — Telephone Encounter (Signed)
Patient has been scheduled for OV tomorrow with Dr. Salomon Fick.

## 2022-11-15 ENCOUNTER — Ambulatory Visit: Payer: BC Managed Care – PPO | Admitting: Family Medicine

## 2022-11-15 ENCOUNTER — Encounter: Payer: Self-pay | Admitting: Family Medicine

## 2022-11-15 VITALS — BP 132/86 | HR 88 | Temp 98.5°F | Wt 181.0 lb

## 2022-11-15 DIAGNOSIS — E119 Type 2 diabetes mellitus without complications: Secondary | ICD-10-CM

## 2022-11-15 DIAGNOSIS — Z20818 Contact with and (suspected) exposure to other bacterial communicable diseases: Secondary | ICD-10-CM

## 2022-11-15 DIAGNOSIS — Z7985 Long-term (current) use of injectable non-insulin antidiabetic drugs: Secondary | ICD-10-CM | POA: Diagnosis not present

## 2022-11-15 NOTE — Progress Notes (Signed)
   Established Patient Office Visit   Subjective  Patient ID: Carla Williams, female    DOB: [birth year]  Age: 57 y.o. MRN: [id number]  Chief Complaint  Patient presents with   MRSA exposure    Found out yesterday. Granddaughter had surgery, and found out it was MRSA. Pt feels fine, but is diabetic and wants to see if she is okay     Patient is a 57 year old female with pmh sig for DM II, HTN, GERD, HLD who is followed by Dr. Caryl Never and seen for acute concern.  Patient exposed to MRSA.  Pt's granddaughter being treated for an infected lymph node that was surgically drained.  Pt helping with dressing changes.   Patient is asymptomatic but wanted to come in due to h/o DM.  Pt denies fever, chills, skin lesions.    Past Medical History:  Diagnosis Date   Colon polyps    hyperplastic   Diabetes mellitus    GERD (gastroesophageal reflux disease)    Hyperlipemia    Hypertension    Obesity    Past Surgical History:  Procedure Laterality Date   TUBAL LIGATION     Social History   Tobacco Use   Smoking status: Never   Smokeless tobacco: Never  Vaping Use   Vaping Use: Never used  Substance Use Topics   Alcohol use: No   Drug use: No   Family History  Problem Relation Age of Onset   COPD Mother    Diabetes Father        and Mother   Heart disease Father    COPD Brother    Breast cancer Other        Maternal Great Aunt    Colon cancer Neg Hx    Stomach cancer Neg Hx    Esophageal cancer Neg Hx    No Known Allergies    ROS Negative unless stated above    Objective:     BP 132/86 (BP Location: Right Arm, Patient Position: Sitting, Cuff Size: Normal)   Pulse 88   Temp 98.5 F (36.9 C) (Oral)   Wt 181 lb (82.1 kg)   LMP 03/15/2013   SpO2 97%   BMI 28.75 kg/m    Physical Exam Constitutional:      General: She is not in acute distress.    Appearance: Normal appearance.  HENT:     Head: Normocephalic and atraumatic.     Nose: Nose normal.      Mouth/Throat:     Mouth: Mucous membranes are moist.  Cardiovascular:     Rate and Rhythm: Normal rate.     Heart sounds: No murmur heard.    No gallop.  Pulmonary:     Effort: Pulmonary effort is normal. No respiratory distress.     Breath sounds: No wheezing, rhonchi or rales.  Skin:    General: Skin is warm and dry.  Neurological:     Mental Status: She is alert and oriented to person, place, and time.      No results found for any visits on 11/15/22.    Assessment & Plan:  Exposure to MRSA -Asymptomatic  Diabetes mellitus without complication (HCC) -Stable  Discussed hand hygiene, cleaning surfaces thoroughly, wearing gloves when changing granddaughter's dressings, etc.  Given precautions.  Return if symptoms worsen or fail to improve.   Deeann Saint, MD

## 2022-12-04 ENCOUNTER — Other Ambulatory Visit (HOSPITAL_COMMUNITY): Payer: Self-pay

## 2023-01-10 LAB — LAB REPORT - SCANNED
A1c: 6
Albumin, Urine POC: 5.7
Albumin/Creatinine Ratio, Urine, POC: 6
Creatinine, POC: 98.5 mg/dL
EGFR: 73

## 2023-01-21 ENCOUNTER — Ambulatory Visit (INDEPENDENT_AMBULATORY_CARE_PROVIDER_SITE_OTHER): Payer: BC Managed Care – PPO | Admitting: Family Medicine

## 2023-01-21 ENCOUNTER — Encounter: Payer: Self-pay | Admitting: Family Medicine

## 2023-01-21 VITALS — BP 140/74 | HR 70 | Temp 97.8°F | Ht 64.96 in | Wt 178.8 lb

## 2023-01-21 DIAGNOSIS — E119 Type 2 diabetes mellitus without complications: Secondary | ICD-10-CM

## 2023-01-21 DIAGNOSIS — Z0000 Encounter for general adult medical examination without abnormal findings: Secondary | ICD-10-CM

## 2023-01-21 NOTE — Progress Notes (Signed)
Established Patient Office Visit  Subjective   Patient ID: Carla Williams, female    DOB: January 20, 1966  Age: 57 y.o. MRN: [id number]  Chief Complaint  Patient presents with   Annual Exam    HPI   Naketa is seen for physical exam.  He has chronic problems including hypertension, obesity, GERD, type 2 diabetes, dyslipidemia.  She is followed by endocrinology and currently on Mounjaro 10 mg subcutaneous once weekly.  She has lost about 18 pounds since last year.  Feels better overall.  A1c went from over 11 to 6.0 recently.  She had recent multiple labs done per endocrinology and these were reviewed.  We have no record yet.  She had total cholesterol 150 with triglycerides of 93 HDL of 51, and LDL of 82.  Also had normal TSH.  Urine microalbumin screen was unremarkable.  CMP unremarkable.  She does take benazepril HCTZ for hypertension.  Her blood pressures been well-controlled but other readings outside of here but up slightly here today.  She thinks this is atypical for her.  Health maintenance reviewed  -Sees GYN for Pap smear and mammograms and these are up-to-date -Prior hepatitis C screen negative -Shingrix complete -Tetanus due 2028 -Diabetic eye exam up-to-date -Gets annual flu vaccine in the fall  Social history-married and has 1 daughter and 1 son.  She now has 5 grandchildren and had her first granddaughter this year.  She works as a Sports coach high school.  Hopes to retire in a few years.  Rare alcohol use.  Family history-Father had coronary disease in his 77s and has diabetes type 2.  Mother had history of COPD and died of what sounds like COVID complications.  She has 1 brother and 1 sister.  1 brother with COPD history.  He is a smoker also   Past Medical History:  Diagnosis Date   Colon polyps    hyperplastic   Diabetes mellitus    GERD (gastroesophageal reflux disease)    Hyperlipemia    Hypertension    Obesity    Past Surgical History:  Procedure  Laterality Date   TUBAL LIGATION      reports that she has never smoked. She has never used smokeless tobacco. She reports that she does not drink alcohol and does not use drugs. family history includes Breast cancer in an other family member; COPD in her brother and mother; Diabetes in her father; Heart disease in her father. No Known Allergies  Review of Systems  Constitutional:  Negative for chills, fever, malaise/fatigue and weight loss.  HENT:  Negative for hearing loss.   Eyes:  Negative for blurred vision and double vision.  Respiratory:  Negative for cough and shortness of breath.   Cardiovascular:  Negative for chest pain, palpitations and leg swelling.  Gastrointestinal:  Negative for abdominal pain, blood in stool, constipation and diarrhea.  Genitourinary:  Negative for dysuria.  Skin:  Negative for rash.  Neurological:  Negative for dizziness, speech change, seizures, loss of consciousness and headaches.  Psychiatric/Behavioral:  Negative for depression.       Objective:     BP (!) 140/74 (BP Location: Left Arm, Patient Position: Sitting, Cuff Size: Normal)   Pulse 70   Temp 97.8 F (36.6 C) (Oral)   Ht 5' 4.96" (1.65 m)   Wt 178 lb 12.8 oz (81.1 kg)   LMP 03/15/2013   SpO2 99%   BMI 29.79 kg/m  BP Readings from Last 3 Encounters:  01/21/23 Marland Kitchen)  140/74  11/15/22 132/86  05/01/22 130/70   Wt Readings from Last 3 Encounters:  01/21/23 178 lb 12.8 oz (81.1 kg)  11/15/22 181 lb (82.1 kg)  05/01/22 198 lb 11.2 oz (90.1 kg)      Physical Exam Vitals reviewed.  Constitutional:      Appearance: She is well-developed.  HENT:     Head: Normocephalic and atraumatic.     Right Ear: Tympanic membrane normal.     Left Ear: Tympanic membrane normal.  Eyes:     Pupils: Pupils are equal, round, and reactive to light.  Neck:     Thyroid: No thyromegaly.  Cardiovascular:     Rate and Rhythm: Normal rate and regular rhythm.     Heart sounds: Normal heart sounds. No  murmur heard. Pulmonary:     Effort: No respiratory distress.     Breath sounds: Normal breath sounds. No wheezing or rales.  Abdominal:     General: Bowel sounds are normal. There is no distension.     Palpations: Abdomen is soft. There is no mass.     Tenderness: There is no abdominal tenderness. There is no guarding or rebound.  Musculoskeletal:     Cervical back: Normal range of motion and neck supple.     Right lower leg: No edema.     Left lower leg: No edema.  Lymphadenopathy:     Cervical: No cervical adenopathy.  Skin:    Findings: No rash.  Neurological:     Mental Status: She is alert and oriented to person, place, and time.     Cranial Nerves: No cranial nerve deficit.  Psychiatric:        Behavior: Behavior normal.        Thought Content: Thought content normal.        Judgment: Judgment normal.      No results found for any visits on 01/21/23.  Last CBC Lab Results  Component Value Date   WBC 4.5 01/11/2022   HGB 13.9 01/11/2022   HCT 42.5 01/11/2022   MCV 92.7 01/11/2022   RDW 13.4 01/11/2022   PLT 257.0 01/11/2022   Last metabolic panel Lab Results  Component Value Date   GLUCOSE 112 (H) 01/11/2022   NA 141 01/11/2022   K 3.9 01/11/2022   CL 103 01/11/2022   CO2 28 01/11/2022   BUN 14 01/11/2022   CREATININE 0.77 01/11/2022   GFR 86.08 01/11/2022   CALCIUM 9.6 01/11/2022   PROT 7.5 01/11/2022   ALBUMIN 4.6 01/11/2022   BILITOT 0.8 01/11/2022   ALKPHOS 49 01/11/2022   AST 23 01/11/2022   ALT 34 01/11/2022   Last lipids Lab Results  Component Value Date   CHOL 160 01/11/2022   HDL 55.20 01/11/2022   LDLCALC 90 01/11/2022   LDLDIRECT 100.0 09/29/2014   TRIG 77.0 01/11/2022   CHOLHDL 3 01/11/2022   Last hemoglobin A1c Lab Results  Component Value Date   HGBA1C 7.2 09/08/2017   Last thyroid functions Lab Results  Component Value Date   TSH 1.45 01/11/2022      The 10-year ASCVD risk score (Arnett DK, et al., 2019) is: 14%     Assessment & Plan:   Problem List Items Addressed This Visit   None Visit Diagnoses     Physical exam    -  Primary     Patient has chronic problems as above.  She has type 2 diabetes managed per endocrinology with recent A1c 6.0%.  She has  lost tremendous amount of weight on Mounjaro.  Blood pressure was up a bit today but she states this is very atypical for her.  Would recommend close monitoring and be in touch if consistently greater than 130/80.  If remains up consider addition of low-dose calcium channel blocker such as amlodipine.  -Continue annual flu vaccine -Continue GYN follow-up for Pap smear mammograms -We again discussed possible coronary calcium score to consider based on her family history of heart disease -Continue high-dose statin with atorvastatin 40 mg daily  No follow-ups on file.    Evelena Peat, MD

## 2023-01-21 NOTE — Patient Instructions (Signed)
Monitor blood pressure and be in touch if not consistently < 130/80.   ?

## 2023-02-05 ENCOUNTER — Telehealth: Payer: BC Managed Care – PPO | Admitting: Family Medicine

## 2023-02-05 VITALS — Ht 63.0 in | Wt 178.0 lb

## 2023-02-05 DIAGNOSIS — U071 COVID-19: Secondary | ICD-10-CM

## 2023-02-05 NOTE — Progress Notes (Signed)
Patient ID: Carla Williams, female   DOB: [birth year], 57 y.o.   MRN: [id number]   Virtual Visit via Video Note  I connected with Carla Williams on 02/05/23 at 11:00 AM EDT by a video enabled telemedicine application and verified that I am speaking with the correct person using two identifiers.  Location patient: home Location provider:work or home office Persons participating in the virtual visit: patient, provider  I discussed the limitations of evaluation and management by telemedicine and the availability of in person appointments. The patient expressed understanding and agreed to proceed.   HPI: Carla Williams had onset of symptoms Monday of some headache, chills, sore throat, nasal congestion, and mild cough.  She tested yesterday and this came back positive for COVID.  She did work Monday and Tuesday but is out today.  She has taken some over-the-counter ibuprofen and also DayQuil.  She did have COVID back in 2021.  Denies any dyspnea.  No nausea, vomiting, or diarrhea.  She is a non-smoker.  Her chronic problems include history of hypertension, GERD, type 2 diabetes, dyslipidemia   ROS: See pertinent positives and negatives per HPI.  Past Medical History:  Diagnosis Date   Colon polyps    hyperplastic   Diabetes mellitus    GERD (gastroesophageal reflux disease)    Hyperlipemia    Hypertension    Obesity     Past Surgical History:  Procedure Laterality Date   TUBAL LIGATION      Family History  Problem Relation Age of Onset   COPD Mother    Diabetes Father        and Mother   Heart disease Father    COPD Brother    Breast cancer Other        Maternal Great Aunt    Colon cancer Neg Hx    Stomach cancer Neg Hx    Esophageal cancer Neg Hx     SOCIAL HX: Non-smoker   Current Outpatient Medications:    atorvastatin (LIPITOR) 40 MG tablet, TAKE 1 TABLET (40 MG TOTAL) BY MOUTH DAILY. ONE TABLET BY MOUTH ONCE DAILY, Disp: 90 tablet, Rfl: 1   benazepril-hydrochlorthiazide  (LOTENSIN HCT) 10-12.5 MG tablet, TAKE 1 TABLET BY MOUTH EVERY DAY, Disp: 90 tablet, Rfl: 1   cetirizine (ZYRTEC ALLERGY) 10 MG tablet, Take 1 tablet (10 mg total) by mouth daily., Disp: 30 tablet, Rfl: 0   Dapagliflozin-metFORMIN HCl ER (XIGDUO XR) 03-999 MG TB24, Take 1 tablet by mouth daily., Disp: , Rfl:    esomeprazole (NEXIUM) 40 MG capsule, TAKE 1 CAPSULE (40 MG TOTAL) BY MOUTH DAILY., Disp: 90 capsule, Rfl: 0   famotidine (PEPCID) 40 MG tablet, Take by mouth., Disp: , Rfl:    fluconazole (DIFLUCAN) 150 MG tablet, Take 1 tablet today.  Take second tablet 3 days later., Disp: 2 tablet, Rfl: 0   ibuprofen (ADVIL) 800 MG tablet, Take 1 tablet (800 mg total) by mouth as needed., Disp: 30 tablet, Rfl: 1   Insulin Pen Needle (NOVOTWIST) 32G X 5 MM MISC, Use one per day to inject Victoza, Disp: 50 each, Rfl: 3   mupirocin ointment (BACTROBAN) 2 %, USE AS NEEDED ON OPEN SORE UP TO 3 TIMES DAILY, Disp: 22 g, Rfl: 1   ONETOUCH DELICA LANCETS 33G MISC, Use to check blood sugar 2 times per day dx code E11.9, Disp: 100 each, Rfl: 3   ONETOUCH VERIO test strip, USE AS INSTRUCTED TO CHECK BLOOD SUGAR 2 TIMES PER DAY DX CODE E11.9, Disp:  100 each, Rfl: 3   pseudoephedrine (SUDAFED) 30 MG tablet, Take 1 tablet (30 mg total) by mouth every 8 (eight) hours as needed for congestion., Disp: 30 tablet, Rfl: 0   tirzepatide (MOUNJARO) 10 MG/0.5ML Pen, Inject 10 mg into the skin once a week., Disp: , Rfl:   EXAM:  VITALS per patient if applicable:  GENERAL: alert, oriented, appears well and in no acute distress  HEENT: atraumatic, conjunttiva clear, no obvious abnormalities on inspection of external nose and ears  NECK: normal movements of the head and neck  LUNGS: on inspection no signs of respiratory distress, breathing rate appears normal, no obvious gross SOB, gasping or wheezing  CV: no obvious cyanosis  MS: moves all visible extremities without noticeable abnormality  PSYCH/NEURO: pleasant and  cooperative, no obvious depression or anxiety, speech and thought processing grossly intact  ASSESSMENT AND PLAN:  Discussed the following assessment and plan:  COVID-day 3 of symptoms.  Her symptoms are stable.  We did discuss possible antiviral therapy.  Paxlovid would have several potential interactions with her usual medications.  Since she is stable and perhaps slightly improved continue current course without antivirals at this time.  -Work note provided from today through Friday -Discussed isolation recommendations -Plenty of fluids and rest -Continue over-the-counter medications such as DayQuil and ibuprofen as needed for symptoms     I discussed the assessment and treatment plan with the patient. The patient was provided an opportunity to ask questions and all were answered. The patient agreed with the plan and demonstrated an understanding of the instructions.   The patient was advised to call back or seek an in-person evaluation if the symptoms worsen or if the condition fails to improve as anticipated.     Evelena Peat, MD

## 2023-02-07 ENCOUNTER — Other Ambulatory Visit: Payer: Self-pay | Admitting: Family Medicine

## 2023-02-07 ENCOUNTER — Encounter: Payer: Self-pay | Admitting: Family Medicine

## 2023-02-12 ENCOUNTER — Encounter: Payer: Self-pay | Admitting: Family Medicine

## 2023-02-12 ENCOUNTER — Ambulatory Visit (INDEPENDENT_AMBULATORY_CARE_PROVIDER_SITE_OTHER): Payer: BC Managed Care – PPO | Admitting: Family Medicine

## 2023-02-12 VITALS — BP 164/100 | HR 80 | Temp 97.7°F | Ht 63.0 in | Wt 178.4 lb

## 2023-02-12 DIAGNOSIS — R300 Dysuria: Secondary | ICD-10-CM

## 2023-02-12 LAB — POC URINALSYSI DIPSTICK (AUTOMATED)
Bilirubin, UA: NEGATIVE
Blood, UA: POSITIVE
Glucose, UA: POSITIVE — AB
Ketones, UA: NEGATIVE
Nitrite, UA: NEGATIVE
Protein, UA: POSITIVE — AB
Spec Grav, UA: 1.02 (ref 1.010–1.025)
Urobilinogen, UA: 0.2 E.U./dL
pH, UA: 6 (ref 5.0–8.0)

## 2023-02-12 MED ORDER — CEPHALEXIN 500 MG PO CAPS: 500.0000 mg | ORAL_CAPSULE | Freq: Three times a day (TID) | ORAL | 0 refills | Status: DC

## 2023-02-12 NOTE — Progress Notes (Unsigned)
Established Patient Office Visit  Subjective   Patient ID: Carla Williams, female    DOB: 1965/11/06  Age: 57 y.o. MRN: [id number]  Chief Complaint  Patient presents with   Urinary Tract Infection    HPI  {History (Optional):23778} Carla Williams is seen with onset Saturday of some urine frequency and burning with urination.  No gross hematuria.  She has had urinary infections in the past with very similar symptoms.  No fever.  No nausea or vomiting.  No flank pain.  Past Medical History:  Diagnosis Date   Colon polyps    hyperplastic   Diabetes mellitus    GERD (gastroesophageal reflux disease)    Hyperlipemia    Hypertension    Obesity    Past Surgical History:  Procedure Laterality Date   TUBAL LIGATION      reports that she has never smoked. She has never used smokeless tobacco. She reports that she does not drink alcohol and does not use drugs. family history includes Breast cancer in an other family member; COPD in her brother and mother; Diabetes in her father; Heart disease in her father. No Known Allergies  Review of Systems  Constitutional:  Negative for chills and fever.  Genitourinary:  Positive for dysuria and frequency. Negative for flank pain and hematuria.      Objective:     BP (!) 164/100 (BP Location: Left Arm, Patient Position: Sitting, Cuff Size: Normal)   Pulse 80   Temp 97.7 F (36.5 C) (Oral)   Ht 5\' 3"  (1.6 m)   Wt 178 lb 6.4 oz (80.9 kg)   LMP 03/15/2013   SpO2 95%   BMI 31.60 kg/m  BP Readings from Last 3 Encounters:  02/12/23 (!) 164/100  01/23/23 (!) 140/74  11/15/22 132/86   Wt Readings from Last 3 Encounters:  02/12/23 178 lb 6.4 oz (80.9 kg)  02/05/23 178 lb (80.7 kg)  01/21/23 178 lb 12.8 oz (81.1 kg)      Physical Exam Vitals reviewed.  Constitutional:      Appearance: Normal appearance.  Cardiovascular:     Rate and Rhythm: Normal rate and regular rhythm.  Pulmonary:     Effort: Pulmonary effort is normal.     Breath  sounds: Normal breath sounds.  Neurological:     Mental Status: She is alert.      Results for orders placed or performed in visit on 02/12/23  POCT Urinalysis Dipstick (Automated)  Result Value Ref Range   Color, UA Yellow    Clarity, UA Clear    Glucose, UA Positive (A) Negative   Bilirubin, UA Negative    Ketones, UA Negative    Spec Grav, UA 1.020 1.010 - 1.025   Blood, UA Positive    pH, UA 6.0 5.0 - 8.0   Protein, UA Positive (A) Negative   Urobilinogen, UA 0.2 0.2 or 1.0 E.U./dL   Nitrite, UA Negative    Leukocytes, UA Large (3+) (A) Negative    {Labs (Optional):23779}  The 10-year ASCVD risk score (Arnett DK, et al., 2019) is: 22.4%    Assessment & Plan:   Problem List Items Addressed This Visit   None Visit Diagnoses     Dysuria    -  Primary   Relevant Orders   POCT Urinalysis Dipstick (Automated) (Completed)   Urine Culture     Urine dipstick reveals large leukocytes and positive blood.  Suspect uncomplicated cystitis.  Start Keflex 500 mg 3 times daily for 7 days.  Urine culture sent.  Drink plenty of fluids.  Her blood pressure was also up significantly today which is atypical for her.  She will monitor closely at home and be in touch if consistent systolic readings over 140.  No follow-ups on file.    Evelena Peat, MD

## 2023-02-16 LAB — URINE CULTURE
MICRO NUMBER:: 15335819
SPECIMEN QUALITY:: ADEQUATE

## 2023-03-11 ENCOUNTER — Other Ambulatory Visit: Payer: Self-pay | Admitting: Family Medicine

## 2023-03-11 DIAGNOSIS — Z1231 Encounter for screening mammogram for malignant neoplasm of breast: Secondary | ICD-10-CM

## 2023-04-16 ENCOUNTER — Other Ambulatory Visit: Payer: Self-pay | Admitting: Family Medicine

## 2023-05-02 ENCOUNTER — Ambulatory Visit: Payer: BC Managed Care – PPO

## 2023-05-27 ENCOUNTER — Encounter: Payer: Self-pay | Admitting: Family Medicine

## 2023-05-27 ENCOUNTER — Ambulatory Visit (INDEPENDENT_AMBULATORY_CARE_PROVIDER_SITE_OTHER): Payer: BC Managed Care – PPO | Admitting: Family Medicine

## 2023-05-27 ENCOUNTER — Ambulatory Visit: Payer: BC Managed Care – PPO

## 2023-05-27 VITALS — BP 130/76 | HR 73 | Temp 98.3°F | Ht 63.0 in | Wt 184.8 lb

## 2023-05-27 DIAGNOSIS — N611 Abscess of the breast and nipple: Secondary | ICD-10-CM | POA: Diagnosis not present

## 2023-05-27 NOTE — Patient Instructions (Signed)
Leave off the hydrogen peroxide and neosporin  Clean daily with soap and water  May apply small amount of vaseline daily.

## 2023-05-27 NOTE — Progress Notes (Signed)
   Established Patient Office Visit  Subjective   Patient ID: Carla Williams, female    DOB: 1965/10/05  Age: 57 y.o. MRN: [id number]  Chief Complaint  Patient presents with   Skin Problem    Patient complains of boil under right breast, x1 week    HPI   Carla Williams is seen with recent "boil "on her right breast.  This came up over a week ago.  This became increasingly painful and about a week ago at this time she placed some fat back over this 1 night.  When she woke up the next morning she had significant amount of drainage and states that her abscess had "burst ".  Felt much better afterwards.  Has been gradually improving since then.  She has been using some Neosporin and hydrogen peroxide.  No fevers or chills.  No cellulitis changes.  She had been scheduled for mammogram today but postponed a week because of the soreness.  No known history of MRSA.  Past Medical History:  Diagnosis Date   Colon polyps    hyperplastic   Diabetes mellitus    GERD (gastroesophageal reflux disease)    Hyperlipemia    Hypertension    Obesity    Past Surgical History:  Procedure Laterality Date   TUBAL LIGATION      reports that she has never smoked. She has never used smokeless tobacco. She reports that she does not drink alcohol and does not use drugs. family history includes Breast cancer in an other family member; COPD in her brother and mother; Diabetes in her father; Heart disease in her father. No Known Allergies  Review of Systems  Constitutional:  Negative for chills and fever.      Objective:     BP 130/76 (BP Location: Left Arm, Patient Position: Sitting, Cuff Size: Normal)   Pulse 73   Temp 98.3 F (36.8 C) (Oral)   Ht 5\' 3"  (1.6 m)   Wt 184 lb 12.8 oz (83.8 kg)   LMP 03/15/2013   SpO2 97%   BMI 32.74 kg/m  BP Readings from Last 3 Encounters:  05/27/23 130/76  02/13/23 (!) 164/100  01/23/23 (!) 140/74   Wt Readings from Last 3 Encounters:  05/27/23 184 lb 12.8 oz (83.8  kg)  02/12/23 178 lb 6.4 oz (80.9 kg)  02/05/23 178 lb (80.7 kg)      Physical Exam Vitals reviewed.  Constitutional:      General: She is not in acute distress.    Appearance: She is not ill-appearing.  Cardiovascular:     Rate and Rhythm: Normal rate and regular rhythm.  Skin:    Comments: She has a small crusted area underneath her right breast with about a 2 x 2 cm surrounding area of induration.  No fluctuance.  No erythema.  No visible purulent drainage at this time  Neurological:     Mental Status: She is alert.      No results found for any visits on 05/27/23.    The 10-year ASCVD risk score (Arnett DK, et al., 2019) is: 11.8%    Assessment & Plan:   Resolving abscess under right breast.  This spontaneously burst and she has had gradual improvement since then.  No signs of cellulitis.  Recommended:  -Leave off peroxide and Neosporin -Clean gently daily with soap and water -Follow-up promptly for any recurrent soreness or redness  Evelena Peat, MD

## 2023-06-03 ENCOUNTER — Ambulatory Visit
Admission: RE | Admit: 2023-06-03 | Discharge: 2023-06-03 | Disposition: A | Discharge status: Home / Self Care | Payer: BC Managed Care – PPO | Source: Ambulatory Visit | Attending: Family Medicine | Admitting: Family Medicine

## 2023-06-03 DIAGNOSIS — Z1231 Encounter for screening mammogram for malignant neoplasm of breast: Secondary | ICD-10-CM

## 2023-06-04 ENCOUNTER — Ambulatory Visit: Payer: BC Managed Care – PPO

## 2023-07-22 ENCOUNTER — Ambulatory Visit
Admission: EM | Admit: 2023-07-22 | Discharge: 2023-07-22 | Disposition: A | Discharge status: Home / Self Care | Payer: 59 | Attending: Family Medicine | Admitting: Family Medicine

## 2023-07-22 DIAGNOSIS — N3090 Cystitis, unspecified without hematuria: Secondary | ICD-10-CM | POA: Insufficient documentation

## 2023-07-22 LAB — POCT URINALYSIS DIP (MANUAL ENTRY)
Bilirubin, UA: NEGATIVE
Glucose, UA: NEGATIVE mg/dL
Ketones, POC UA: NEGATIVE mg/dL
Nitrite, UA: NEGATIVE
Protein Ur, POC: 30 mg/dL — AB
Spec Grav, UA: 1.025 (ref 1.010–1.025)
Urobilinogen, UA: 1 U/dL
pH, UA: 6 (ref 5.0–8.0)

## 2023-07-22 MED ORDER — CEPHALEXIN 500 MG PO CAPS: 500.0000 mg | ORAL_CAPSULE | Freq: Two times a day (BID) | ORAL | 0 refills | Status: DC

## 2023-07-22 NOTE — Discharge Instructions (Signed)
You have had labs (urine culture) sent today. We will call you with any significant abnormalities or if there is need to begin or change treatment or pursue further follow up.  You may also review your test results online through MyChart. If you do not have a MyChart account, instructions to sign up should be on your discharge paperwork.  

## 2023-07-22 NOTE — ED Triage Notes (Signed)
Patient presents to office for bladder leakage that started 3 days ago. Patient denies any other symptoms.

## 2023-07-23 NOTE — ED Provider Notes (Signed)
[facility]    ASSESSMENT & PLAN:  1. Cystitis    Begn: Meds ordered this encounter  Medications   cephALEXin (KEFLEX) 500 MG capsule    Sig: Take 1 capsule (500 mg total) by mouth 2 (two) times daily.    Dispense:  10 capsule    Refill:  0   No signs of pyelonephritis. Urine culture sent. Will notify patient of any significant results. Ensure proper hydration. Will follow up with her PCP or here if not showing improvement over the next 48 hours, sooner if needed.  Outlined signs and symptoms indicating need for more acute intervention. Patient verbalized understanding. After Visit Summary given.  SUBJECTIVE:  Carla Williams is a 58 y.o. female who complains of "bladder leakage" that started 3 days ago. Patient denies any other symptoms. Questions UTI. Denies fever/n/v/abd pain. No tx PTA.  LMP: Patient's last menstrual period was 03/15/2013.  OBJECTIVE:  Vitals:   07/22/23 1918 07/22/23 1921 07/22/23 1922  BP:  (!) 152/78   Pulse: 73  82  Resp: 16 16   Temp: 98.1 F (36.7 C)  98.1 F (36.7 C)  TempSrc:   Oral  SpO2: 98% 98%    General appearance: alert; no distress HENT: oropharynx: moist Lungs: unlabored respirations Abdomen: soft, non-tender; bowel sounds normal; no masses or organomegaly; no guarding or rebound tenderness Back: no CVA tenderness Extremities: no edema; symmetrical with no gross deformities Skin: warm and dry Neurologic: normal gait Psychological: alert and cooperative; normal mood and affect  Labs Reviewed  POCT URINALYSIS DIP (MANUAL ENTRY) - Abnormal; Notable for the following components:      Result Value   Blood, UA moderate (*)    Protein Ur, POC =30 (*)    Leukocytes, UA Small (1+) (*)    All other components within normal limits  URINE CULTURE    No Known Allergies  Past Medical History:  Diagnosis Date   Colon polyps    hyperplastic   Diabetes mellitus    GERD (gastroesophageal reflux disease)     Hyperlipemia    Hypertension    Obesity    Social History   Socioeconomic History   Marital status: Married    Spouse name: Not on file   Number of children: 2   Years of education: Not on file   Highest education level: Not on file  Occupational History   Occupation: Child psychotherapist  Tobacco Use   Smoking status: Never   Smokeless tobacco: Never  Vaping Use   Vaping status: Never Used  Substance and Sexual Activity   Alcohol use: No   Drug use: No   Sexual activity: Not on file  Other Topics Concern   Not on file  Social History Narrative   One caffeine drink daily    Social Drivers of Corporate investment banker Strain: Not on file  Food Insecurity: Not on file  Transportation Needs: Not on file  Physical Activity: Not on file  Stress: Not on file  Social Connections: Unknown (12/03/2022)   Received from [facility]   Social Network    Social Network: Not on file  Intimate Partner Violence: Unknown (12/03/2022)   Received from [facility]   HITS    Physically Hurt: Not on file    Insult or Talk Down To: Not on file    Threaten Physical Harm: Not on file    Scream or Curse: Not on file   Family History  Problem Relation Age of  Onset   COPD Mother    Diabetes Father        and Mother   Heart disease Father    COPD Brother    Breast cancer Other        Maternal Great Aunt    Colon cancer Neg Hx    Stomach cancer Neg Hx    Esophageal cancer Neg Hx         Mardella Layman, MD 07/23/23 1341

## 2023-07-24 LAB — URINE CULTURE: Culture: >=100000 — AB

## 2023-08-02 ENCOUNTER — Other Ambulatory Visit (HOSPITAL_BASED_OUTPATIENT_CLINIC_OR_DEPARTMENT_OTHER): Payer: Self-pay

## 2023-08-02 MED ORDER — MOUNJARO 10 MG/0.5ML ~~LOC~~ SOAJ
10.0000 mg | SUBCUTANEOUS | 5 refills | Status: AC
  Filled 2023-08-02: qty 2, 28d supply, fill #0
  Filled 2023-09-02: qty 2, 28d supply, fill #1
  Filled 2023-10-06: qty 2, 28d supply, fill #2
  Filled 2023-11-24: qty 2, 28d supply, fill #3
  Filled 2023-11-26: qty 2, 28d supply, fill #0
  Filled 2024-01-13: qty 2, 28d supply, fill #1

## 2023-08-11 ENCOUNTER — Ambulatory Visit: Payer: Self-pay | Admitting: Family Medicine

## 2023-08-11 ENCOUNTER — Encounter: Payer: Self-pay | Admitting: Family Medicine

## 2023-08-11 ENCOUNTER — Ambulatory Visit (INDEPENDENT_AMBULATORY_CARE_PROVIDER_SITE_OTHER): Payer: 59 | Admitting: Family Medicine

## 2023-08-11 DIAGNOSIS — N3000 Acute cystitis without hematuria: Secondary | ICD-10-CM

## 2023-08-11 DIAGNOSIS — R300 Dysuria: Secondary | ICD-10-CM | POA: Diagnosis not present

## 2023-08-11 LAB — POCT URINALYSIS DIPSTICK
Bilirubin, UA: NEGATIVE
Blood, UA: NEGATIVE
Glucose, UA: NEGATIVE
Ketones, UA: NEGATIVE
Nitrite, UA: POSITIVE
Protein, UA: NEGATIVE
Spec Grav, UA: >=1.03 — AB (ref 1.010–1.025)
Urobilinogen, UA: 0.2 U/dL
pH, UA: 6 (ref 5.0–8.0)

## 2023-08-11 MED ORDER — AMOXICILLIN-POT CLAVULANATE 875-125 MG PO TABS
1.0000 | ORAL_TABLET | Freq: Two times a day (BID) | ORAL | 0 refills | Status: AC
Start: 2023-08-11 — End: 2023-08-18

## 2023-08-11 NOTE — Telephone Encounter (Signed)
  1st attempt, left voicemail for patient to call back for triage.  Copied From CRM [id number]. Reason for Triage: Patient states she thinks she may have a UTI - burning while urinating, frequent urination. Also experiencing congestion with a scratchy throat.

## 2023-08-11 NOTE — Progress Notes (Addendum)
 Acute Office Visit  Subjective:     Patient ID: Carla Williams, female    DOB: October 14, 1965, 58 y.o.   MRN: [id number]  Chief Complaint  Patient presents with   Dysuria    HPI Patient is in today for possible UTI. Reports dysuria. Has recently had UTI, was treated with Keflex  on 07/22/23. States that symptoms did improve and then returned about 4 days ago. Has not attempted to treat at home. Denies abdominal pain, nausea, vomiting, diarrhea, rash, fever, chills, other symptoms. Medical history as outlined below.  ROS Per HPI      Objective:    BP 120/82 (BP Location: Left Arm, Patient Position: Sitting)   Pulse 81   Temp 97.6 F (36.4 C) (Temporal)   Ht 5\' 3"  (1.6 m)   Wt 181 lb (82.1 kg)   LMP 03/15/2013   SpO2 98%   BMI 32.06 kg/m    Physical Exam Vitals and nursing note reviewed.  Constitutional:      General: She is not in acute distress.    Appearance: Normal appearance. She is normal weight.  HENT:     Head: Normocephalic and atraumatic.     Right Ear: Tympanic membrane and ear canal normal.     Left Ear: Tympanic membrane and ear canal normal.     Nose: Congestion present.     Mouth/Throat:     Mouth: Mucous membranes are moist.     Pharynx: Oropharynx is clear.  Eyes:     Extraocular Movements: Extraocular movements intact.  Cardiovascular:     Rate and Rhythm: Normal rate and regular rhythm.     Pulses: Normal pulses.     Heart sounds: Normal heart sounds.  Pulmonary:     Effort: Pulmonary effort is normal. No respiratory distress.     Breath sounds: No wheezing, rhonchi or rales.  Abdominal:     General: There is no distension.     Palpations: Abdomen is soft. There is no mass.     Tenderness: There is no abdominal tenderness. There is no right CVA tenderness or left CVA tenderness.  Musculoskeletal:     Cervical back: Normal range of motion.  Lymphadenopathy:     Cervical: No cervical adenopathy.  Skin:    General: Skin is warm and  dry.  Neurological:     General: No focal deficit present.     Mental Status: She is alert.  Psychiatric:        Mood and Affect: Mood normal.        Behavior: Behavior normal.    Results for orders placed or performed in visit on 08/11/23  POCT urinalysis dipstick  Result Value Ref Range   Color, UA yellow    Clarity, UA clear    Glucose, UA Negative Negative   Bilirubin, UA negative    Ketones, UA negative    Spec Grav, UA >=1.030 (A) 1.010 - 1.025   Blood, UA negative    pH, UA 6.0 5.0 - 8.0   Protein, UA Negative Negative   Urobilinogen, UA 0.2 0.2 or 1.0 E.U./dL   Nitrite, UA positive    Leukocytes, UA Large (3+) (A) Negative   Appearance     Odor          Assessment & Plan:  1. Acute cystitis without hematuria  - amoxicillin -clavulanate (AUGMENTIN ) 875-125 MG tablet; Take 1 tablet by mouth 2 (two) times daily for 7 days.  Dispense: 14 tablet; Refill: 0 + nitrites - Will  tx with Augmentin  given most recent culture on 07/22/23 with Strep A  2. Dysuria  - POCT urinalysis dipstick - Urine Culture; Future   Meds ordered this encounter  Medications   amoxicillin -clavulanate (AUGMENTIN ) 875-125 MG tablet    Sig: Take 1 tablet by mouth 2 (two) times daily for 7 days.    Dispense:  14 tablet    Refill:  0    Return if symptoms worsen or fail to improve.  Casimer Clear, FNP

## 2023-08-11 NOTE — Telephone Encounter (Signed)
  Chief Complaint: Burning with urination Symptoms: Burning with urination, urinary frequency, pelvic pressure, nasal congestion, nasal discharge/runny nose, dry cough, headache  Frequency: constant Pertinent Negatives: Patient denies Flank pain, fever, sputum, blood when wiping Disposition: [] ED /[] Urgent Care (no appt availability in office) / [x] Appointment(In office/virtual)/ []  [facility]/ [] Home Care/ [] Refused Recommended Disposition /[] [facility]/ []  Follow-up with PCP Additional Notes: Patient called with complaints of UTI symptoms and cold symptoms. Patient states that urinary symptoms began 10 days ago and progressed to a low grade fever and back pain on Saturday that has now resolved to only urinary burning, urinary frequency and some pelvic pressure. Patient states that she was seen in the ED on 1/21 for symptoms and given antibiotics but symptoms persist.  Patient also had complaints of cold symptoms since Thursday that includes runny nose, dry cough, nasal congestion and headache. Patient has self treated with Mucinex  with no relief. Patient advised by this RN to be seen today, to which patient was agreeable. Patient advised by this RN to call back with worsening symptoms. Patient verbalized understanding.   Reason for Disposition  Urinating more frequently than usual (i.e., frequency)  Answer Assessment - Initial Assessment Questions 1. SYMPTOM: "What's the main symptom you're concerned about?" (e.g., frequency, incontinence)     Urinary, frequency burning, pelvic pressure. 2. ONSET: "When did the start?"     10 days ago. 3. PAIN: "Is there any pain?" If Yes, ask: "How bad is it?" (Scale: 1-10; mild, moderate, severe)     Denies 4. CAUSE: "What do you think is causing the symptoms?"     Denies 5. OTHER SYMPTOMS: "Do you have any other symptoms?" (e.g., blood in urine, fever, flank pain, pain with urination)     Cough, runny nose, nasty congestion,  headache - symptom onset Thursday  Protocols used: Urinary Symptoms-A-AH

## 2023-08-13 ENCOUNTER — Encounter: Payer: Self-pay | Admitting: Family Medicine

## 2023-08-13 LAB — URINE CULTURE

## 2023-09-02 ENCOUNTER — Other Ambulatory Visit (HOSPITAL_BASED_OUTPATIENT_CLINIC_OR_DEPARTMENT_OTHER): Payer: Self-pay

## 2023-09-08 ENCOUNTER — Other Ambulatory Visit: Payer: Self-pay | Admitting: Family Medicine

## 2023-09-08 DIAGNOSIS — Z1231 Encounter for screening mammogram for malignant neoplasm of breast: Secondary | ICD-10-CM

## 2023-09-12 ENCOUNTER — Other Ambulatory Visit: Payer: Self-pay | Admitting: Obstetrics and Gynecology

## 2023-09-12 DIAGNOSIS — N951 Menopausal and female climacteric states: Secondary | ICD-10-CM

## 2023-09-19 ENCOUNTER — Other Ambulatory Visit: Payer: Self-pay | Admitting: Obstetrics and Gynecology

## 2023-09-19 DIAGNOSIS — N951 Menopausal and female climacteric states: Secondary | ICD-10-CM

## 2023-09-19 DIAGNOSIS — E2839 Other primary ovarian failure: Secondary | ICD-10-CM

## 2023-10-06 ENCOUNTER — Other Ambulatory Visit: Payer: Self-pay

## 2023-10-07 ENCOUNTER — Other Ambulatory Visit: Payer: Self-pay

## 2023-10-07 ENCOUNTER — Other Ambulatory Visit (HOSPITAL_COMMUNITY): Payer: Self-pay

## 2023-10-07 ENCOUNTER — Other Ambulatory Visit: Payer: Self-pay | Admitting: Family Medicine

## 2023-11-05 ENCOUNTER — Other Ambulatory Visit: Payer: Self-pay | Admitting: Family Medicine

## 2023-11-26 ENCOUNTER — Other Ambulatory Visit: Payer: Self-pay

## 2023-11-27 ENCOUNTER — Other Ambulatory Visit (HOSPITAL_BASED_OUTPATIENT_CLINIC_OR_DEPARTMENT_OTHER): Payer: Self-pay

## 2023-12-22 ENCOUNTER — Ambulatory Visit: Admitting: Family Medicine

## 2023-12-22 ENCOUNTER — Encounter: Payer: Self-pay | Admitting: Family Medicine

## 2023-12-22 VITALS — BP 136/84 | HR 82 | Temp 98.2°F | Resp 98 | Ht 63.0 in | Wt 187.0 lb

## 2023-12-22 DIAGNOSIS — L0291 Cutaneous abscess, unspecified: Secondary | ICD-10-CM

## 2023-12-22 MED ORDER — MUPIROCIN 2 % EX OINT: 1.0000 | TOPICAL_OINTMENT | Freq: Two times a day (BID) | CUTANEOUS | 0 refills | Status: AC

## 2023-12-22 MED ORDER — DOXYCYCLINE HYCLATE 100 MG PO TABS: 100.0000 mg | ORAL_TABLET | Freq: Two times a day (BID) | ORAL | 0 refills | Status: AC

## 2023-12-22 NOTE — Progress Notes (Signed)
   Established Patient Office Visit   Subjective:  Patient ID: Carla Williams, female    DOB: 02-07-1966  Age: 58 y.o. MRN: [id number]  Chief Complaint  Patient presents with   Cyst    Boil X4 days     HPI She reports she had an abscess appear on her left majora labia last Thursday. She reports she used a different soap that irritated her and has a history of abscesses. She reports she used a previous prescription of Bactroban  ointment and peroxide to help the boil. She has been able to drain the abscess, starting on Saturday, but it has almost stopped draining.  ROS See HPI above     Objective:   BP 136/84   Pulse 82   Temp 98.2 F (36.8 C) (Oral)   Resp (!) 98   Ht 5' 3 (1.6 m)   Wt 187 lb (84.8 kg)   LMP 03/15/2013   BMI 33.13 kg/m    Physical Exam Vitals reviewed.  Constitutional:      General: She is not in acute distress.    Appearance: Normal appearance. She is not ill-appearing, toxic-appearing or diaphoretic.  HENT:     Head: Normocephalic and atraumatic.   Eyes:     General:        Right eye: No discharge.        Left eye: No discharge.     Conjunctiva/sclera: Conjunctivae normal.    Cardiovascular:     Rate and Rhythm: Normal rate.  Pulmonary:     Effort: Pulmonary effort is normal. No respiratory distress.   Musculoskeletal:        General: Normal range of motion.   Skin:    General: Skin is warm and dry.     Findings: No abscess (On left majora labia. No drainage. Tender, hard area about the size of a marble. Pink tissue noted on over the hard area. No redness.).   Neurological:     General: No focal deficit present.     Mental Status: She is alert and oriented to person, place, and time. Mental status is at baseline.   Psychiatric:        Mood and Affect: Mood normal.        Behavior: Behavior normal.        Thought Content: Thought content normal.        Judgment: Judgment normal.      Assessment & Plan:  Abscess -      Doxycycline  Hyclate; Take 1 tablet (100 mg total) by mouth 2 (two) times daily for 10 days.  Dispense: 20 tablet; Refill: 0 -     Mupirocin ; Apply 1 Application topically 2 (two) times daily for 10 days.  Dispense: 20 g; Refill: 0  -Recommend to stop using peroxide.  -Cleanse the abscess with Dial Antibacterial Soap or over the counter Chlorhexidine soap twice a day, pat dry, and apply prescribed Mupirocin  ointment. Apply a thin layer.  -Continue to apply warm compresses to the area, 4-6 times a day, up to 20 minutes at at time.  -Follow up if not improved or abscess becomes worse.   Toma Arts, NP

## 2023-12-22 NOTE — Patient Instructions (Addendum)
-  It was nice to care for you today.  -Recommend to stop using peroxide.  -Cleanse the abscess with Dial Antibacterial Soap or over the counter Chlorhexidine soap twice a day, pat dry, and apply prescribed Mupirocin  ointment. Apply a thin layer.  -Continue to apply warm compresses to the area, 4-6 times a day, up to 20 minutes at at time.  -Follow up if not improved or abscess becomes worse.

## 2024-01-01 ENCOUNTER — Telehealth: Payer: Self-pay | Admitting: Family Medicine

## 2024-01-01 NOTE — Telephone Encounter (Signed)
 Copied from CRM [id number]. Topic: Clinical - Medication Refill >> Jan 01, 2024  4:28 PM Berneda F wrote: Medication: ibuprofen  (ADVIL ) 800 MG tablet  Please note, this is showing as discontinued, but pt does not think this should be discontinued.  Has the patient contacted their pharmacy? Yes (Agent: If no, request that the patient contact the pharmacy for the refill. If patient does not wish to contact the pharmacy document the reason why and proceed with request.) (Agent: If yes, when and what did the pharmacy advise?)  This is the patient's preferred pharmacy:  [id number] [place], [place] - [address] [address] Phone: [phone] Fax: [phone]  Is this the correct pharmacy for this prescription? Yes If no, delete pharmacy and type the correct one.   Has the prescription been filled recently? No  Is the patient out of the medication? Yes  Has the patient been seen for an appointment in the last year OR does the patient have an upcoming appointment? Yes  Can we respond through MyChart? Yes  Agent: Please be advised that Rx refills may take up to 3 business days. We ask that you follow-up with your pharmacy.

## 2024-01-05 MED ORDER — IBUPROFEN 800 MG PO TABS: 800.0000 mg | ORAL_TABLET | ORAL | 0 refills | Status: DC | PRN

## 2024-01-13 ENCOUNTER — Ambulatory Visit: Payer: Self-pay

## 2024-01-13 ENCOUNTER — Other Ambulatory Visit: Payer: Self-pay

## 2024-01-13 NOTE — Telephone Encounter (Signed)
 FYI Only or Action Required?: FYI only for provider.  Patient was last seen in primary care on 12/22/2023 by Carla Philippe SAUNDERS, NP.  Called Nurse Triage reporting Urinary Frequency.  Symptoms began several days ago.  Interventions attempted: Rest, hydration, or home remedies.  Symptoms are: unchanged.  Triage Disposition: See Physician Within 24 Hours  Patient/caregiver understands and will follow disposition?: Yes  Copied from CRM [id number]. Topic: Clinical - Red Word Triage >> Jan 13, 2024  4:37 PM Carla Williams wrote: Red Word that prompted transfer to Nurse Triage: Patient experiencing blood in urine: states it is a very light color red/pink in urine. Frequent urination. Not experiencing much pain at the moment. Reason for Disposition  Urinating more frequently than usual (i.e., frequency) OR new-onset of the feeling of an urgent need to urinate (i.e., urgency)  Answer Assessment - Initial Assessment Questions 1. SYMPTOM: What's the main symptom you're concerned about? (e.g., frequency, incontinence)     Urinary frequency 2. ONSET: When did the  frequent urination  start?     Started last week 3. PAIN: Is there any pain? If Yes, ask: How bad is it? (Scale: 1-10; mild, moderate, severe)     Minimal pain 4. CAUSE: What do you think is causing the symptoms?     Patient concerned for possible UTI 5. OTHER SYMPTOMS: Do you have any other symptoms? (e.g., blood in urine, fever, flank pain, pain with urination)     Blood in urine  Protocols used: Urinary Symptoms-A-AH

## 2024-01-14 ENCOUNTER — Ambulatory Visit: Admitting: Family Medicine

## 2024-01-14 ENCOUNTER — Encounter: Payer: Self-pay | Admitting: Family Medicine

## 2024-01-14 VITALS — BP 150/86 | HR 70 | Temp 97.9°F | Wt 186.5 lb

## 2024-01-14 DIAGNOSIS — E119 Type 2 diabetes mellitus without complications: Secondary | ICD-10-CM

## 2024-01-14 DIAGNOSIS — Z7985 Long-term (current) use of injectable non-insulin antidiabetic drugs: Secondary | ICD-10-CM | POA: Diagnosis not present

## 2024-01-14 DIAGNOSIS — R350 Frequency of micturition: Secondary | ICD-10-CM | POA: Diagnosis not present

## 2024-01-14 LAB — POC URINALSYSI DIPSTICK (AUTOMATED)
Bilirubin, UA: NEGATIVE
Blood, UA: NEGATIVE
Glucose, UA: NEGATIVE
Ketones, UA: NEGATIVE
Nitrite, UA: NEGATIVE
Protein, UA: POSITIVE — AB
Spec Grav, UA: 1.025 (ref 1.010–1.025)
Urobilinogen, UA: 0.2 U/dL
pH, UA: 6 (ref 5.0–8.0)

## 2024-01-14 LAB — MICROALBUMIN / CREATININE URINE RATIO
Creatinine,U: 188.6 mg/dL
Microalb Creat Ratio: 11.2 mg/g (ref 0.0–30.0)
Microalb, Ur: 2.1 mg/dL — ABNORMAL HIGH (ref 0.0–1.9)

## 2024-01-14 NOTE — Patient Instructions (Signed)
 Stay well hydrated  We will call with urine culture results, but not highly suspicious for UTI.

## 2024-01-14 NOTE — Progress Notes (Signed)
   Established Patient Office Visit  Subjective   Patient ID: Carla Williams, female    DOB: Dec 24, 1965  Age: 58 y.o. MRN: [id number]  Chief Complaint  Patient presents with   Urinary Frequency    HPI   Carla Williams is seen with approximately 6-day history of some urinary frequency.  She also notes low bit of pinkish discolored urine.  No cloudiness.  No burning with urination.  No fever.  No flank pain.  No nausea or vomiting.  Was seen here June 23 with small abscess labia majora and treated with doxycycline  and that did resolve.  She does not recall any major drainage.  She does have type 2 diabetes and currently on Mounjaro  and well-controlled.  She thinks her last A1c was 6.2%.  Followed by endocrinology.  No SGLT2 use.  Past Medical History:  Diagnosis Date   Colon polyps    hyperplastic   Diabetes mellitus    GERD (gastroesophageal reflux disease)    Hyperlipemia    Hypertension    Obesity    Past Surgical History:  Procedure Laterality Date   TUBAL LIGATION      reports that she has never smoked. She has never used smokeless tobacco. She reports that she does not drink alcohol and does not use drugs. family history includes Breast cancer in an other family member; COPD in her brother and mother; Diabetes in her father; Heart disease in her father. No Known Allergies  Review of Systems  Constitutional:  Negative for chills and fever.  Genitourinary:  Positive for frequency. Negative for flank pain.      Objective:     BP (!) 150/86 (BP Location: Left Arm, Patient Position: Sitting, Cuff Size: Normal)   Pulse 70   Temp 97.9 F (36.6 C) (Oral)   Wt 186 lb 8 oz (84.6 kg)   LMP 03/15/2013   SpO2 96%   BMI 33.04 kg/m  BP Readings from Last 3 Encounters:  01/14/24 (!) 150/86  12/22/23 136/84  08/11/23 120/82   Wt Readings from Last 3 Encounters:  01/14/24 186 lb 8 oz (84.6 kg)  12/22/23 187 lb (84.8 kg)  08/11/23 181 lb (82.1 kg)      Physical Exam Vitals  reviewed.  Constitutional:      General: She is not in acute distress.    Appearance: She is not ill-appearing.  Cardiovascular:     Rate and Rhythm: Normal rate and regular rhythm.  Neurological:     Mental Status: She is alert.      No results found for any visits on 01/14/24.    The 10-year ASCVD risk score (Arnett DK, et al., 2019) is: 20.2%    Assessment & Plan:   Urine frequency.  No major burning with urination.  Urine dipstick reveals no blood.  Only small leukocytes.  No nitrites.  No SGLT2 use.  No recent dietary changes.  She has some urgency symptoms.  -Urine culture sent - Keep caffeine use down - Did discuss possible medication options for urgency such as Gemtesa or Myrbetriq if symptoms persist   No follow-ups on file.    Wolm Scarlet, MD

## 2024-01-15 LAB — URINE CULTURE
MICRO NUMBER:: 16706402
SPECIMEN QUALITY:: ADEQUATE

## 2024-01-16 ENCOUNTER — Ambulatory Visit: Payer: Self-pay | Admitting: Family Medicine

## 2024-01-23 ENCOUNTER — Ambulatory Visit (INDEPENDENT_AMBULATORY_CARE_PROVIDER_SITE_OTHER): Payer: BC Managed Care – PPO | Admitting: Family Medicine

## 2024-01-23 VITALS — BP 112/70 | HR 76 | Temp 97.8°F | Ht 63.0 in | Wt 185.8 lb

## 2024-01-23 DIAGNOSIS — Z0000 Encounter for general adult medical examination without abnormal findings: Secondary | ICD-10-CM

## 2024-01-23 DIAGNOSIS — Z8249 Family history of ischemic heart disease and other diseases of the circulatory system: Secondary | ICD-10-CM

## 2024-01-23 LAB — CBC WITH DIFFERENTIAL/PLATELET
Basophils Absolute: 0 K/uL (ref 0.0–0.1)
Basophils Relative: 0.4 % (ref 0.0–3.0)
Eosinophils Absolute: 0.1 K/uL (ref 0.0–0.7)
Eosinophils Relative: 1.5 % (ref 0.0–5.0)
HCT: 40.8 % (ref 36.0–46.0)
Hemoglobin: 13.6 g/dL (ref 12.0–15.0)
Lymphocytes Relative: 47.3 % — ABNORMAL HIGH (ref 12.0–46.0)
Lymphs Abs: 1.8 K/uL (ref 0.7–4.0)
MCHC: 33.4 g/dL (ref 30.0–36.0)
MCV: 92 fl (ref 78.0–100.0)
Monocytes Absolute: 0.3 K/uL (ref 0.1–1.0)
Monocytes Relative: 7.4 % (ref 3.0–12.0)
Neutro Abs: 1.7 K/uL (ref 1.4–7.7)
Neutrophils Relative %: 43.4 % (ref 43.0–77.0)
Platelets: 244 K/uL (ref 150.0–400.0)
RBC: 4.43 Mil/uL (ref 3.87–5.11)
RDW: 12.7 % (ref 11.5–15.5)
WBC: 3.8 K/uL — ABNORMAL LOW (ref 4.0–10.5)

## 2024-01-23 LAB — TSH: TSH: 1.26 u[IU]/mL (ref 0.35–5.50)

## 2024-01-23 NOTE — Progress Notes (Signed)
 Established Patient Office Visit  Subjective   Patient ID: Carla Williams, female    DOB: [birth year]  Age: 58 y.o. MRN: [id number]  Chief Complaint  Patient presents with   Annual Exam    Pt reports she is fasting and had blood work with endocrinologist on 7/21.     HPI   Carla Williams is seen for physical exam today.  She still sees gynecologist.  Her past medical history significant for hypertension, GERD, type 2 diabetes, dyslipidemia.  She is followed by endocrinology as well and currently on Mounjaro .  A1c recently 6.2%.  Just few days ago earlier this month she had labs including lipid, CMP, A1c, and urine microalbumin screen.  These were reviewed.  She had total cholesterol 179, HDL of 80, triglycerides 63, and LDL of 87.  Health maintenance reviewed:  Health Maintenance  Topic Date Due   Pneumococcal Vaccine 69-36 Years old (2 of 2 - PCV) 12/28/2020   Diabetic kidney evaluation - eGFR measurement  01/10/2024   FOOT EXAM  01/21/2024   COVID-19 Vaccine (4 - 2024-25 season) 02/08/2024 (Originally 03/02/2023)   Cervical Cancer Screening (HPV/Pap Cotest)  04/24/2024 (Originally 04/16/2015)   HIV Screening  01/22/2025 (Originally 05/12/1980)   INFLUENZA VACCINE  01/30/2024   HEMOGLOBIN A1C  07/21/2024   OPHTHALMOLOGY EXAM  09/16/2024   Diabetic kidney evaluation - Urine ACR  01/13/2025   MAMMOGRAM  06/02/2025   Colonoscopy  08/01/2025   DTaP/Tdap/Td (3 - Td or Tdap) 09/08/2026   Hepatitis B Vaccines  Completed   Hepatitis C Screening  Completed   Zoster Vaccines- Shingrix   Completed   HPV VACCINES  Aged Out   Meningococcal B Vaccine  Aged Out   Social history-married and has 1 daughter and 1 son.  She now has 5 grandchildren and had her first granddaughter this year.  She works as a Sports coach high school.  Hopes to retire in a few years.  Rare alcohol use.   Family history-Father had coronary disease in his 62s and has diabetes type 2.  Mother had history of COPD  and died of what sounds like COVID complications.  She has 1 brother and 1 sister.  1 brother with COPD history.  He is a smoker also   Past Medical History:  Diagnosis Date   Colon polyps    hyperplastic   Diabetes mellitus    GERD (gastroesophageal reflux disease)    Hyperlipemia    Hypertension    Obesity    Past Surgical History:  Procedure Laterality Date   TUBAL LIGATION      reports that she has never smoked. She has never used smokeless tobacco. She reports that she does not drink alcohol and does not use drugs. family history includes Breast cancer in an other family member; COPD in her brother and mother; Diabetes in her father; Heart disease in her father. No Known Allergies  Review of Systems  Constitutional:  Negative for malaise/fatigue.  Eyes:  Negative for blurred vision.  Respiratory:  Negative for shortness of breath.   Cardiovascular:  Negative for chest pain.  Neurological:  Negative for dizziness, weakness and headaches.      Objective:     BP 112/70 (BP Location: Left Arm, Patient Position: Sitting, Cuff Size: Large)   Pulse 76   Temp 97.8 F (36.6 C) (Oral)   Ht 5' 3 (1.6 m)   Wt 185 lb 12.8 oz (84.3 kg)   LMP 03/15/2013   SpO2 97%  BMI 32.91 kg/m  BP Readings from Last 3 Encounters:  01/23/24 112/70  01/14/24 (!) 150/86  12/22/23 136/84   Wt Readings from Last 3 Encounters:  01/23/24 185 lb 12.8 oz (84.3 kg)  01/14/24 186 lb 8 oz (84.6 kg)  12/22/23 187 lb (84.8 kg)      Physical Exam Vitals reviewed.  Constitutional:      Appearance: She is well-developed.  HENT:     Head: Normocephalic and atraumatic.  Eyes:     Pupils: Pupils are equal, round, and reactive to light.  Neck:     Thyroid : No thyromegaly.  Cardiovascular:     Rate and Rhythm: Normal rate and regular rhythm.     Heart sounds: Normal heart sounds. No murmur heard. Pulmonary:     Effort: No respiratory distress.     Breath sounds: Normal breath sounds. No  wheezing or rales.  Abdominal:     General: Bowel sounds are normal. There is no distension.     Palpations: Abdomen is soft. There is no mass.     Tenderness: There is no abdominal tenderness. There is no guarding or rebound.  Musculoskeletal:        General: Normal range of motion.     Cervical back: Normal range of motion and neck supple.  Lymphadenopathy:     Cervical: No cervical adenopathy.  Skin:    Findings: No rash.     Comments: Feet reveal no skin lesions. Good distal foot pulses. Good capillary refill. No calluses. Normal sensation with monofilament testing   Neurological:     Mental Status: She is alert and oriented to person, place, and time.     Cranial Nerves: No cranial nerve deficit.     Deep Tendon Reflexes: Reflexes normal.  Psychiatric:        Behavior: Behavior normal.        Thought Content: Thought content normal.        Judgment: Judgment normal.      No results found for any visits on 01/23/24.  Last CBC Lab Results  Component Value Date   WBC 4.5 01/11/2022   HGB 13.9 01/11/2022   HCT 42.5 01/11/2022   MCV 92.7 01/11/2022   RDW 13.4 01/11/2022   PLT 257.0 01/11/2022   Last metabolic panel Lab Results  Component Value Date   GLUCOSE 112 (H) 01/11/2022   NA 141 01/11/2022   K 3.9 01/11/2022   CL 103 01/11/2022   CO2 28 01/11/2022   BUN 14 01/11/2022   CREATININE 0.77 01/11/2022   EGFR 73.0 01/10/2023   CALCIUM  9.6 01/11/2022   PROT 7.5 01/11/2022   ALBUMIN 4.6 01/11/2022   BILITOT 0.8 01/11/2022   ALKPHOS 49 01/11/2022   AST 23 01/11/2022   ALT 34 01/11/2022   Last lipids Lab Results  Component Value Date   CHOL 160 01/11/2022   HDL 55.20 01/11/2022   LDLCALC 90 01/11/2022   LDLDIRECT 100.0 09/29/2014   TRIG 77.0 01/11/2022   CHOLHDL 3 01/11/2022   Last hemoglobin A1c Lab Results  Component Value Date   HGBA1C 7.2 09/08/2017   Last thyroid  functions Lab Results  Component Value Date   TSH 1.45 01/11/2022   Last  vitamin D No results found for: 25OHVITD2, 25OHVITD3, VD25OH    The 10-year ASCVD risk score (Arnett DK, et al., 2019) is: 6.4%    Assessment & Plan:   Problem List Items Addressed This Visit   None Visit Diagnoses  Physical exam    -  Primary   Relevant Orders   CBC with Differential/Platelet   TSH     Family history of premature CAD       Relevant Orders   CT CARDIAC SCORING (SELF PAY ONLY)     58 year old female with chronic medical problems as above.  She has diabetes which is well-controlled with recent A1c 6.2%.  Mild hyperlipidemia.  -Colonoscopy due 2027 - Continue annual flu vaccine - Continue with regular mammograms - Did discuss pneumonia vaccine and recommended this and she declined but will consider at some point this year - Discussed coronary calcium  score for further risk stratification and she agrees to getting this set up.  This was ordered  No follow-ups on file.    Wolm Scarlet, MD

## 2024-01-25 ENCOUNTER — Ambulatory Visit: Payer: Self-pay | Admitting: Family Medicine

## 2024-01-25 DIAGNOSIS — N390 Urinary tract infection, site not specified: Secondary | ICD-10-CM

## 2024-01-28 ENCOUNTER — Other Ambulatory Visit: Payer: Self-pay

## 2024-01-28 MED ORDER — TIRZEPATIDE 12.5 MG/0.5ML ~~LOC~~ SOAJ
12.5000 mg | SUBCUTANEOUS | 3 refills | Status: DC
  Filled 2024-01-28 – 2024-03-02 (×2): qty 2, 28d supply, fill #0
  Filled 2024-03-29 – 2024-04-28 (×2): qty 2, 28d supply, fill #1
  Filled 2024-05-25: qty 2, 28d supply, fill #2
  Filled 2024-06-25: qty 2, 28d supply, fill #3

## 2024-01-28 NOTE — Addendum Note (Signed)
 Addended by: METTA KRISTEN CROME on: 01/28/2024 09:04 AM   Modules accepted: Orders

## 2024-02-02 ENCOUNTER — Other Ambulatory Visit: Payer: Self-pay | Admitting: Family Medicine

## 2024-03-02 ENCOUNTER — Other Ambulatory Visit: Payer: Self-pay

## 2024-03-02 ENCOUNTER — Other Ambulatory Visit (HOSPITAL_COMMUNITY): Payer: Self-pay

## 2024-03-23 ENCOUNTER — Other Ambulatory Visit (HOSPITAL_COMMUNITY): Payer: Self-pay

## 2024-03-23 ENCOUNTER — Other Ambulatory Visit: Payer: Self-pay

## 2024-03-23 MED ORDER — ACCU-CHEK SOFTCLIX LANCETS MISC
11 refills | Status: AC
  Filled 2024-03-23: qty 100, 50d supply, fill #0

## 2024-03-23 MED ORDER — ACCU-CHEK GUIDE TEST VI STRP
ORAL_STRIP | 11 refills | Status: AC
  Filled 2024-03-23: qty 100, 50d supply, fill #0

## 2024-03-23 MED ORDER — ONETOUCH VERIO VI STRP
ORAL_STRIP | Freq: Two times a day (BID) | 0 refills | Status: AC
  Filled 2024-03-23: qty 100, 50d supply, fill #0

## 2024-03-23 MED ORDER — ACCU-CHEK GUIDE ME W/DEVICE KIT
PACK | 0 refills | Status: AC
  Filled 2024-03-23: qty 1, 30d supply, fill #0

## 2024-03-25 ENCOUNTER — Telehealth: Payer: Self-pay | Admitting: *Deleted

## 2024-03-25 NOTE — Telephone Encounter (Signed)
 Left detailed message for patient in regards to referral information. Mychart message also sent.

## 2024-03-25 NOTE — Telephone Encounter (Signed)
 Copied from CRM #[id number]. Topic: General - Other >> Mar 25, 2024  8:29 AM [place] A wrote: Reason for CRM: Patient would like to know the Urology office in which the referral was sent to.

## 2024-04-08 ENCOUNTER — Other Ambulatory Visit (HOSPITAL_COMMUNITY): Payer: Self-pay

## 2024-04-28 ENCOUNTER — Other Ambulatory Visit: Payer: Self-pay

## 2024-05-21 ENCOUNTER — Other Ambulatory Visit

## 2024-05-25 ENCOUNTER — Other Ambulatory Visit: Payer: Self-pay

## 2024-05-26 ENCOUNTER — Other Ambulatory Visit: Payer: Self-pay

## 2024-05-28 ENCOUNTER — Other Ambulatory Visit: Payer: Self-pay

## 2024-06-03 ENCOUNTER — Inpatient Hospital Stay: Admission: RE | Admit: 2024-06-03 | Discharge: 2024-06-03 | Attending: Family Medicine | Admitting: Family Medicine

## 2024-06-03 ENCOUNTER — Other Ambulatory Visit

## 2024-06-03 DIAGNOSIS — Z1231 Encounter for screening mammogram for malignant neoplasm of breast: Secondary | ICD-10-CM

## 2024-06-14 ENCOUNTER — Other Ambulatory Visit: Payer: Self-pay | Admitting: Family Medicine

## 2024-06-14 DIAGNOSIS — R928 Other abnormal and inconclusive findings on diagnostic imaging of breast: Secondary | ICD-10-CM

## 2024-06-22 ENCOUNTER — Ambulatory Visit
Admission: RE | Admit: 2024-06-22 | Discharge: 2024-06-22 | Disposition: A | Discharge status: Home / Self Care | Source: Ambulatory Visit | Attending: Family Medicine | Admitting: Family Medicine

## 2024-06-22 DIAGNOSIS — R928 Other abnormal and inconclusive findings on diagnostic imaging of breast: Secondary | ICD-10-CM

## 2024-06-25 ENCOUNTER — Other Ambulatory Visit: Payer: Self-pay

## 2024-07-02 ENCOUNTER — Other Ambulatory Visit: Payer: Self-pay

## 2024-07-02 ENCOUNTER — Other Ambulatory Visit: Payer: Self-pay | Admitting: Family Medicine

## 2024-07-02 DIAGNOSIS — L0291 Cutaneous abscess, unspecified: Secondary | ICD-10-CM

## 2024-07-29 ENCOUNTER — Other Ambulatory Visit: Payer: Self-pay

## 2024-07-29 ENCOUNTER — Other Ambulatory Visit: Payer: Self-pay | Admitting: Family Medicine

## 2024-08-05 ENCOUNTER — Other Ambulatory Visit: Payer: Self-pay

## 2024-08-06 ENCOUNTER — Other Ambulatory Visit (HOSPITAL_COMMUNITY): Payer: Self-pay

## 2024-08-06 ENCOUNTER — Other Ambulatory Visit: Payer: Self-pay

## 2024-08-06 MED ORDER — MOUNJARO 12.5 MG/0.5ML ~~LOC~~ SOAJ
12.5000 mg | SUBCUTANEOUS | 1 refills | Status: AC
  Filled 2024-08-06: qty 2, 28d supply, fill #0

## 2025-01-24 ENCOUNTER — Encounter: Admitting: Family Medicine
